# Patient Record
Sex: Female | Born: 1965 | Race: White | Hispanic: No | Marital: Married | Smoking: Never smoker
Health system: Southern US, Community
[De-identification: ages and names within clinical notes are randomized; demographics above are authoritative.]

## PROBLEM LIST (undated history)

## (undated) DIAGNOSIS — D219 Benign neoplasm of connective and other soft tissue, unspecified: Secondary | ICD-10-CM

## (undated) HISTORY — DX: Benign neoplasm of connective and other soft tissue, unspecified: D21.9

## (undated) HISTORY — PX: HEEL SPUR SURGERY: SHX665

## (undated) HISTORY — PX: SHOULDER SURGERY: SHX246

---

## 1999-06-16 ENCOUNTER — Ambulatory Visit: Admission: RE | Admit: 1999-06-16 | Discharge: 1999-06-16 | Payer: Self-pay | Admitting: Orthopedic Surgery

## 2000-11-18 ENCOUNTER — Encounter: Payer: Self-pay | Admitting: Family Medicine

## 2000-11-18 ENCOUNTER — Encounter: Admission: RE | Admit: 2000-11-18 | Discharge: 2000-11-18 | Payer: Self-pay | Admitting: Family Medicine

## 2000-11-26 ENCOUNTER — Encounter: Admission: RE | Admit: 2000-11-26 | Discharge: 2000-11-26 | Payer: Self-pay | Admitting: Family Medicine

## 2000-11-26 ENCOUNTER — Encounter: Payer: Self-pay | Admitting: Family Medicine

## 2001-02-11 ENCOUNTER — Ambulatory Visit (HOSPITAL_COMMUNITY): Admission: RE | Admit: 2001-02-11 | Discharge: 2001-02-11 | Payer: Self-pay | Admitting: *Deleted

## 2001-02-11 ENCOUNTER — Encounter: Payer: Self-pay | Admitting: *Deleted

## 2001-07-29 ENCOUNTER — Ambulatory Visit (HOSPITAL_BASED_OUTPATIENT_CLINIC_OR_DEPARTMENT_OTHER): Admission: RE | Admit: 2001-07-29 | Discharge: 2001-07-29 | Payer: Self-pay | Admitting: Orthopaedic Surgery

## 2002-02-12 ENCOUNTER — Ambulatory Visit (HOSPITAL_COMMUNITY): Admission: RE | Admit: 2002-02-12 | Discharge: 2002-02-12 | Payer: Self-pay | Admitting: Obstetrics and Gynecology

## 2002-02-12 ENCOUNTER — Encounter: Payer: Self-pay | Admitting: Obstetrics and Gynecology

## 2002-03-25 ENCOUNTER — Ambulatory Visit (HOSPITAL_COMMUNITY): Admission: RE | Admit: 2002-03-25 | Discharge: 2002-03-25 | Payer: Self-pay

## 2002-04-29 ENCOUNTER — Ambulatory Visit (HOSPITAL_COMMUNITY): Admission: RE | Admit: 2002-04-29 | Discharge: 2002-04-29 | Payer: Self-pay

## 2002-04-30 ENCOUNTER — Encounter: Admission: RE | Admit: 2002-04-30 | Discharge: 2002-04-30 | Payer: Self-pay

## 2002-09-22 ENCOUNTER — Encounter (HOSPITAL_COMMUNITY): Admission: RE | Admit: 2002-09-22 | Discharge: 2002-09-22 | Payer: Self-pay

## 2002-09-28 ENCOUNTER — Inpatient Hospital Stay (HOSPITAL_COMMUNITY): Admission: AD | Admit: 2002-09-28 | Discharge: 2002-10-02 | Payer: Self-pay

## 2002-09-30 ENCOUNTER — Encounter (INDEPENDENT_AMBULATORY_CARE_PROVIDER_SITE_OTHER): Payer: Self-pay | Admitting: Interventional Cardiology

## 2002-10-29 ENCOUNTER — Encounter: Payer: Self-pay | Admitting: Orthopedic Surgery

## 2002-10-29 ENCOUNTER — Ambulatory Visit (HOSPITAL_COMMUNITY): Admission: RE | Admit: 2002-10-29 | Discharge: 2002-10-29 | Payer: Self-pay | Admitting: Orthopedic Surgery

## 2003-11-05 ENCOUNTER — Encounter: Admission: RE | Admit: 2003-11-05 | Discharge: 2003-11-05 | Payer: Self-pay | Admitting: Family Medicine

## 2003-11-15 ENCOUNTER — Encounter: Admission: RE | Admit: 2003-11-15 | Discharge: 2003-11-15 | Payer: Self-pay | Admitting: Family Medicine

## 2004-05-19 ENCOUNTER — Encounter: Admission: RE | Admit: 2004-05-19 | Discharge: 2004-05-19 | Payer: Self-pay | Admitting: Family Medicine

## 2004-06-15 ENCOUNTER — Other Ambulatory Visit: Admission: RE | Admit: 2004-06-15 | Discharge: 2004-06-15 | Payer: Self-pay | Admitting: Obstetrics and Gynecology

## 2004-11-23 ENCOUNTER — Encounter: Admission: RE | Admit: 2004-11-23 | Discharge: 2004-11-23 | Payer: Self-pay | Admitting: Family Medicine

## 2004-12-25 ENCOUNTER — Encounter: Admission: RE | Admit: 2004-12-25 | Discharge: 2004-12-25 | Payer: Self-pay | Admitting: *Deleted

## 2005-06-19 ENCOUNTER — Other Ambulatory Visit: Admission: RE | Admit: 2005-06-19 | Discharge: 2005-06-19 | Payer: Self-pay | Admitting: Obstetrics and Gynecology

## 2005-12-03 ENCOUNTER — Encounter: Admission: RE | Admit: 2005-12-03 | Discharge: 2005-12-03 | Payer: Self-pay | Admitting: Family Medicine

## 2006-06-24 ENCOUNTER — Other Ambulatory Visit: Admission: RE | Admit: 2006-06-24 | Discharge: 2006-06-24 | Payer: Self-pay | Admitting: Obstetrics and Gynecology

## 2007-09-09 ENCOUNTER — Other Ambulatory Visit: Admission: RE | Admit: 2007-09-09 | Discharge: 2007-09-09 | Payer: Self-pay | Admitting: Obstetrics and Gynecology

## 2008-11-22 ENCOUNTER — Other Ambulatory Visit: Admission: RE | Admit: 2008-11-22 | Discharge: 2008-11-22 | Payer: Self-pay | Admitting: Obstetrics and Gynecology

## 2008-11-30 ENCOUNTER — Encounter: Admission: RE | Admit: 2008-11-30 | Discharge: 2008-11-30 | Payer: Self-pay | Admitting: Obstetrics and Gynecology

## 2008-12-03 ENCOUNTER — Encounter: Admission: RE | Admit: 2008-12-03 | Discharge: 2008-12-03 | Payer: Self-pay | Admitting: Obstetrics and Gynecology

## 2009-06-03 ENCOUNTER — Encounter: Admission: RE | Admit: 2009-06-03 | Discharge: 2009-06-03 | Payer: Self-pay | Admitting: Obstetrics and Gynecology

## 2009-11-23 ENCOUNTER — Other Ambulatory Visit: Admission: RE | Admit: 2009-11-23 | Discharge: 2009-11-23 | Payer: Self-pay | Admitting: Obstetrics and Gynecology

## 2009-11-25 ENCOUNTER — Encounter: Admission: RE | Admit: 2009-11-25 | Discharge: 2009-11-25 | Payer: Self-pay | Admitting: Obstetrics and Gynecology

## 2010-05-26 ENCOUNTER — Encounter: Admission: RE | Admit: 2010-05-26 | Discharge: 2010-05-26 | Payer: Self-pay | Admitting: Obstetrics and Gynecology

## 2010-11-07 LAB — HM COLONOSCOPY

## 2010-12-04 ENCOUNTER — Other Ambulatory Visit
Admission: RE | Admit: 2010-12-04 | Discharge: 2010-12-04 | Payer: Self-pay | Source: Home / Self Care | Admitting: Obstetrics and Gynecology

## 2010-12-04 ENCOUNTER — Other Ambulatory Visit: Payer: Self-pay | Admitting: Obstetrics and Gynecology

## 2010-12-09 ENCOUNTER — Encounter: Payer: Self-pay | Admitting: Family Medicine

## 2010-12-20 ENCOUNTER — Encounter: Payer: Self-pay | Admitting: Obstetrics and Gynecology

## 2011-01-30 ENCOUNTER — Other Ambulatory Visit: Payer: Self-pay | Admitting: Dermatology

## 2011-03-08 ENCOUNTER — Other Ambulatory Visit: Payer: Self-pay | Admitting: Dermatology

## 2011-03-18 ENCOUNTER — Encounter: Payer: Self-pay | Admitting: Family Medicine

## 2011-03-18 ENCOUNTER — Inpatient Hospital Stay (INDEPENDENT_AMBULATORY_CARE_PROVIDER_SITE_OTHER)
Admission: RE | Admit: 2011-03-18 | Discharge: 2011-03-18 | Disposition: A | Discharge status: Home / Self Care | Payer: Self-pay | Source: Ambulatory Visit | Attending: Family Medicine | Admitting: Family Medicine

## 2011-03-18 DIAGNOSIS — H9209 Otalgia, unspecified ear: Secondary | ICD-10-CM

## 2011-03-18 DIAGNOSIS — J309 Allergic rhinitis, unspecified: Secondary | ICD-10-CM | POA: Insufficient documentation

## 2011-03-18 DIAGNOSIS — M542 Cervicalgia: Secondary | ICD-10-CM

## 2011-03-18 LAB — CONVERTED CEMR LAB: Rapid Strep: NEGATIVE

## 2011-03-20 ENCOUNTER — Telehealth (INDEPENDENT_AMBULATORY_CARE_PROVIDER_SITE_OTHER): Payer: Self-pay

## 2011-04-06 NOTE — Op Note (Signed)
[place]  Patient:    Jennifer [name], Jennifer [name] Visit Number: [id number] MRN: [id number]          Service Type: DSU Location: [facility] Attending Physician:  Marcene Corning Dictated by:   Lubertha Basque. Jerl Santos, M.D. Proc. Date: 07/29/01 Admit Date:  07/29/2001                             Operative Report  PREOPERATIVE DIAGNOSIS:  Right shoulder impingement.  POSTOPERATIVE DIAGNOSES: 1. Right shoulder impingement. 2. Right shoulder adhesions.  PROCEDURES: 1. Right shoulder arthroscopic acromioplasty. 2. Right shoulder arthroscopic debridement.  ANESTHESIA:  General.  SURGEON:  Lubertha Basque. Jerl Santos, M.D.  ASSISTANT:  Prince Rome, P.A.  INDICATION FOR PROCEDURE:  The patient is a 45 year old woman with a long history of right shoulder pain.  This has persisted despite oral anti-inflammatories, activity restriction, and a subacromial injection, which did afford her temporary relief.  She is offered an arthroscopy of the procedure.  The procedure was discussed with the patient, and informed operative consent was obtained after discussion of possible complications of, reaction to the anesthesia, and infection.  DESCRIPTION OF PROCEDURE:  The patient was taken to the operating suite, where a general anesthetic was applied without difficulty.  She was positioned in the beach chair position and prepped and draped in the normal sterile fashion. After the administration of preop IV antibiotics, an arthroscopy of the right shoulder was performed through a total of two portals.  The glenohumeral joint showed no degenerative change, and all labral structures were well-attached, including the biceps anchor.  She did have an adhesion from the rotator cuff under the biceps tendon.  This was debrided and released.  The rotator cuff otherwise appeared benign on undersurface inspection all the way to the attachment site on the greater tuberosity.  In the subacromial  space she had some bursitis and no tearing of the rotator cuff, though it did appear to be inflamed.  She had a prominent subacromial morphology, addressed with an acromioplasty back to a flat surface.  This was done with the bur in the lateral position, followed by transfer of the bur to the posterior position. Her AC joint was not addressed, as she had no pain in that location and it did not appear to impinge on the rotator cuff.  The shoulder was thoroughly irrigated at the end of the case, followed by placement of Marcaine with epinephrine and morphine.  Simple sutures of nylon were used to reapproximate the two portals, followed by Adaptic and a dry gauze dressing with tape. Estimated blood loss and intraoperative fluids can be obtained from anesthesia records.  DISPOSITION:  The patient was extubated in the operating room and taken to the recovery room in stable condition.  Plans were for her to go home the same day and to follow up in the office in less than a week.  I will contact her by phone tonight. Dictated by:   Lubertha Basque Jerl Santos, M.D. Attending Physician:  Marcene Corning DD:  07/29/01 TD:  07/29/01 Job: [id number]

## 2011-04-06 NOTE — Consult Note (Signed)
NAME:  Jennifer [name], VANDERHOOF                            ACCOUNT NO.:  [id number]   MEDICAL RECORD NO.:  [id number]                   PATIENT TYPE:  INP   LOCATION:  9101                                 FACILITY:  [facility]   PHYSICIAN:  Lesleigh Noe, M.D.            DATE OF BIRTH:  09-08-1966   DATE OF CONSULTATION:  09/30/2002  DATE OF DISCHARGE:                                   CONSULTATION   REASON FOR CONSULTATION:  Sepsis, dyspnea.   </CONCLUSIONS>  1. Chest discomfort and dyspnea, probably secondary to anemia (pulmonary     embolus and acute myocardial infarction have been ruled out by EKG,     enzymes, echo, and spiral CT).  2. Status post vaginal delivery, day #1.   RECOMMENDATIONS:  1. No specific therapy.  2. Observation.  3. Usual postpartum activity.  4. Iron therapy to help repair anemia.  5. Call if we can help further.   COMMENTS:  The patient is a previously healthy 45 year old gravida 1, para 1  who is status post vaginal delivery on 09/29/02 at 5 a.m.  This morning,  09/30/02, she developed dyspnea and feeling of chest pressure after  ambulating, and also noticed this sensation while she was sitting. This has  never occurred before. She did not have it yesterday.  There is no history  of heart disease.  She did have some chest discomfort approximately 13 years  ago and saw a physician about it, but never had the complete work-up  performed because the discomfort resolved spontaneously. She denies  orthopnea.  There is no lower extremities swelling, no history of  hypertension, heart disease. She has been told in the past that she had a  heart murmur, but this was by 1 physician on only 1 occasion. There have  been on physical activity limitations throughout her life.  There is no  history of syncope. She does not smoke or drink.   ALLERGIES:  Vicodin causes nausea and vomiting.   MEDICATIONS ON ADMISSION:  Iron and Zyrtec.   FAMILY HISTORY:  Negative for heart  disease.   PHYSICAL EXAMINATION:  VITAL SIGNS:  Respiratory rate of 18 per minute,  blood pressure 122/70, heart rate 88.  GENERAL:  Patient is somewhat pale-  appearing in no respiratory distress.  Nail beds are free of any evidence of  cyanosis.  HEENT:  Pupils are equal and reactive. Conjunctivae somewhat  pale.  LUNGS:  Chest clear to auscultation and percussion.  CARDIOVASCULAR:  Normal with the exception of a faint 1/6 systolic murmur  heard at the left upper sternal border, most consistent with pulmonic  outflow murmur.  ABDOMEN:  Soft, no masses are noted. Liver edge is not  palpable.  EXTREMITIES:  No edema. Radial and posterior tibial pulses are bounding.   LABORATORY DATA:  Patient's EKG reveals normal sinus rhythm, normal tracing,  no evidence of acute  injury, pericarditis, conduction abnormalities or  chamber enlargement. She also had an EKG performed while standing that  revealed inferior T wave changes but I think this is simply related to shift  in axis of the heart and possibly hyperventilation.  Hemoglobin this  afternoon is 8.8, hemoglobin on admission 12.5. Potassium 3.8, BUN 11,  creatinine 0.8. A 2-D echocardiogram done this afternoon is basically  normal, LV cavity size is normal, LV function is normal, no valvular  abnormalities are noted; certainly, no evidence of contractility problems  that would suggest postpartum cardiomyopathy. There is no pericardial  effusion.  Spiral CT was performed at [facility] and by report from the  nurse, was said to be normal.  Troponin I 0.02, CK-MB 212/8.6.   DISCUSSION:  I do not get the sense that there is any acute cardiac problem  at this time.  I suspect that the patient's sensations of chest discomfort  and dyspnea are related to relatively acute development of anemia  postpartum.  Hopefully, as she had spontaneous diuresis and there is  hemoconcentration, over the next 12-36 hours, she will feel better. I would  not  recommend any specific further cardiac evaluation or limitations on  physical activity, other than as dictated by the way she feels.   If I may be of further assistance, do not hesitate to call.                                                 Lesleigh Noe, M.D.    HWS/MEDQ  D:  09/30/2002  T:  09/30/2002  Job:  [id number]   cc:   Ronda Fairly. Galen Daft, M.D.   Bryan Lemma. Manus Gunning, M.D.  [address] [place]  [place] [address]  Fax: [phone]

## 2011-04-06 NOTE — Discharge Summary (Signed)
NAME:  Jennifer [name], Jennifer [name]                            ACCOUNT NO.:  [id number]   MEDICAL RECORD NO.:  [id number]                   PATIENT TYPE:  INP   LOCATION:  9101                                 FACILITY:  [facility]   PHYSICIAN:  Ronda Fairly. Galen Daft, M.D.              DATE OF BIRTH:  15-Aug-1966   DATE OF ADMISSION:  09/28/2002  DATE OF DISCHARGE:  10/02/2002                                 DISCHARGE SUMMARY   ADMISSION DIAGNOSIS:  Term pregnancy, labor.   PRINCIPAL DIAGNOSIS:  Term pregnancy, labor.   SECONDARY DIAGNOSES:  1. Postpartum chest pain.  2. Anemia postpartum.  3. Premature ventricular contractions.  4. Term pregnancy, delivered.   PRINCIPAL PROCEDURE:  Spontaneous vaginal delivery.   SECONDARY PROCEDURES:  1. Spiral CT scan.  2. Cardiac echo.  3. EKG.  4. Chest x-ray.   CONDITION ON DISCHARGE:  Stable.   COMPLICATIONS OF HOSPITALIZATION:  None.   CONSULTATIONS:  Cardiology.   FINAL DIAGNOSES:  1. Term pregnancy, delivered.  2. Atypical chest pain not otherwise specified.   HOSPITAL COURSE:  The patient was admitted on September 28, 2002 just shortly  before midnight.  She then delivered on September 29, 2002 a healthy baby  girl, Kara Mead, and this was an uncomplicated spontaneous vaginal delivery.  There was a small laceration which was repaired with suture without  difficulty using local anesthetic.  The patient had no epidural anesthesia  and went naturally. The placenta delivered spontaneous and there was no  significant bleeding in the antepartum or postpartum period.  However, her  hemoglobin was 8.6 g on postoperative day #1.  On review of systems on  postoperative day #1 the patient reported to have chest tightness described  as a pressure, midsternal, especially when there was any exertion.  It was  associated with shortness of breath.  There were no extremity problems, no  edema, no other symptoms.  She was feeling faint, near syncope on postpartum  day #1  when she first got up, but otherwise completely negative as far as  that was concerned.  The symptoms were quite persistent and reproducible  with the same type of activity.  This was concerning for my patient that  this young 45 year old woman could have possible angina.  There was no  significant family history but nevertheless, given the immediate postpartum  status, differential diagnosis included chest pain of cardiac origin from  angina or ischemia versus cardiomyopathy versus possible pulmonary embolism.  Pulmonary embolism was lower on the diagnostic list because of the at rest  asymptomatic nature and there were no findings on examination to suggest  that.  The chest x-ray was performed and that was negative.  Spiral CT scan  was also performed to exclude any embolic-appearing phenomena which was also  negative.  The patient had a cardiology consultation.  Cardiac enzymes  initially showed slight elevation of the MB fraction  at 8.6 with the total  CK being 212, the ratio slightly elevated at 4.1.  The troponin I was normal  at 0.02.  These results were repeated the following day and the CK showed MB  fraction was 4.8 and the total was 137.  Again, slightly elevated but the  troponin again came back negative at 0.02.  Her hemoglobin was 8.8 g at that  stage, slightly elevated white count at 17,000 with normal platelet count.  The patient's symptoms were slightly better but still persistent at that  point.  The patient was prescribed aspirin therapy for discharge.  Her  examination from a cardiac standpoint and a pulmonary standpoint and  physical exam was all negative on the day of discharge and prior.   DISPOSITION:  1. The patient was discharged home on iron tablets twice a day.  2. Avoid any physical exertion but not to remain at complete bedrest either.  3. Follow up in the office in six to seven weeks.  4. Report any increase in symptoms to me and to cardiology.  5. Follow up  with her primary care physician to seen if any further testing     needed to be done for the chest pain, certainly if this persists or even     if this resolves, to see if any further testing or provocative testing     should be done after recovery.   DISCHARGE MEDICATIONS:  1. One aspirin daily.  2. Two iron tablets daily.  3. Prenatal vitamins.  4. Tylenol for pain.   DIET:  Regular.   WOUND CARE:  Limited to routine perineal care.   ACTIVITY:  Limits were to be pretty much taking it easy without any  significant travel or ambulation.  She was to walk about the house, etc.,  but not any long walks or exercise-type related phenomena.    FOLLOW-UP:  The patient was instructed to follow up with her family  physician in approximately one to two weeks.                                               Ronda Fairly. Galen Daft, M.D.    N[place]T/MEDQ  D:  10/02/2002  T:  10/03/2002  Job:  [id number]   cc:   Bryan Lemma. Manus Gunning, M.D.  [address] [place]  [place] [address]  Fax: [phone]   Lyn Records III, M.D.  [address]  [place]  [place] [address]  Fax: [phone]

## 2011-05-17 ENCOUNTER — Other Ambulatory Visit: Payer: Self-pay | Admitting: Obstetrics and Gynecology

## 2011-05-17 DIAGNOSIS — Z1231 Encounter for screening mammogram for malignant neoplasm of breast: Secondary | ICD-10-CM

## 2011-06-08 ENCOUNTER — Ambulatory Visit
Admission: RE | Admit: 2011-06-08 | Discharge: 2011-06-08 | Disposition: A | Discharge status: Home / Self Care | Payer: BC Managed Care – PPO | Source: Ambulatory Visit | Attending: Obstetrics and Gynecology | Admitting: Obstetrics and Gynecology

## 2011-06-08 DIAGNOSIS — Z1231 Encounter for screening mammogram for malignant neoplasm of breast: Secondary | ICD-10-CM

## 2011-10-17 ENCOUNTER — Emergency Department
Admission: EM | Admit: 2011-10-17 | Discharge: 2011-10-17 | Disposition: A | Discharge status: Home / Self Care | Payer: BC Managed Care – PPO | Source: Home / Self Care | Attending: Family Medicine | Admitting: Family Medicine

## 2011-10-17 ENCOUNTER — Encounter: Payer: Self-pay | Admitting: Emergency Medicine

## 2011-10-17 DIAGNOSIS — J069 Acute upper respiratory infection, unspecified: Secondary | ICD-10-CM

## 2011-10-17 LAB — POCT RAPID STREP A (OFFICE): Rapid Strep A Screen: NEGATIVE

## 2011-10-17 LAB — POCT INFLUENZA A/B
Influenza A, POC: NEGATIVE
Influenza B, POC: NEGATIVE

## 2011-10-17 MED ORDER — BENZONATATE 200 MG PO CAPS: 200.0000 mg | ORAL_CAPSULE | Freq: Every day | ORAL | Status: AC

## 2011-10-17 MED ORDER — AZITHROMYCIN 250 MG PO TABS: ORAL_TABLET | ORAL | Status: AC

## 2011-10-17 NOTE — ED Notes (Signed)
Cough, sore throat, chills, body aches since yesterday morning

## 2011-10-17 NOTE — ED Provider Notes (Signed)
History     CSN: [id number] Arrival date & time: 10/17/2011  8:46 AM   First MD Initiated Contact with Patient 10/17/11 0914      Chief Complaint  Patient presents with  . Cough      HPI Comments: Patient complains of one day history of gradually progressive URI symptoms beginning with a mild sore throat (now improved), followed by progressive nasal congestion.  A cough started last night.  Complains of fatigue and initial myalgias.  Cough is generally non-productive during the day.  There has been no pleuritic pain, shortness of breath, or wheezes.  She states that she just recovered from a cold one week ago that lasted about two weeks.  She feels worse this time with more flu-like symptoms.  She has not had a flu shot.  Patient is a 45 y.o. female presenting with cough. The history is provided by the patient.  Cough This is a recurrent problem. The current episode started 12 to 24 hours ago. The problem occurs every few minutes. The problem has been gradually worsening. The cough is non-productive. There has been no fever. Associated symptoms include chills, sore throat and myalgias. Pertinent negatives include no chest pain, no sweats, no ear congestion, no ear pain, no headaches, no rhinorrhea, no shortness of breath, no wheezing and no eye redness. She has tried nothing for the symptoms. She is not a smoker. Her past medical history does not include pneumonia.    No past medical history on file.  No past surgical history on file.  No family history on file.  History  Substance Use Topics  . Smoking status: Not on file  . Smokeless tobacco: Not on file  . Alcohol Use: Not on file    OB History    Grav Para Term Preterm Abortions TAB SAB Ect Mult Living                  Review of Systems  Constitutional: Positive for chills and fatigue. Negative for fever.  HENT: Positive for congestion and sore throat. Negative for ear pain, rhinorrhea and sinus pressure.   Eyes:  Negative for redness.  Respiratory: Positive for cough. Negative for chest tightness, shortness of breath and wheezing.   Cardiovascular: Negative for chest pain.  Gastrointestinal: Negative.   Genitourinary: Negative.   Musculoskeletal: Positive for myalgias.  Skin: Negative.   Neurological: Negative for headaches.    Allergies  Vicodin  Home Medications   Current Outpatient Rx  Name Route Sig Dispense Refill  . CETIRIZINE HCL 10 MG PO TABS Oral Take 10 mg by mouth daily.      . AZITHROMYCIN 250 MG PO TABS  Take 2 tabs today; then begin one tab once daily for 4 more days. (Rx void after 10/24/11)    6 each 0  . BENZONATATE 200 MG PO CAPS Oral Take 1 capsule (200 mg total) by mouth at bedtime. Take as needed for cough 12 capsule 0    BP 114/75  Pulse 65  Temp(Src) 98.5 F (36.9 C) (Oral)  Resp 16  Ht 5\' 7"  (1.702 m)  Wt 161 lb (73.029 kg)  BMI 25.22 kg/m2  SpO2 100%  LMP 10/14/2011  Physical Exam  Nursing note and vitals reviewed. Constitutional: She is oriented to person, place, and time. She appears well-developed and well-nourished. No distress.  HENT:  Head: Normocephalic.  Right Ear: External ear normal.  Left Ear: External ear normal.  Nose: Mucosal edema and rhinorrhea present. No sinus  tenderness.  Mouth/Throat: Oropharynx is clear and moist. No oropharyngeal exudate.  Eyes: Conjunctivae and EOM are normal. Pupils are equal, round, and reactive to light. Right eye exhibits no discharge. Left eye exhibits no discharge.  Neck: Normal range of motion. Neck supple.  Cardiovascular: Normal rate, regular rhythm and normal heart sounds.   Pulmonary/Chest: Effort normal and breath sounds normal. She has no wheezes. She has no rales. She exhibits no tenderness.  Abdominal: Soft. Bowel sounds are normal. There is no tenderness.  Musculoskeletal: She exhibits no edema.  Lymphadenopathy:    She has cervical adenopathy.       Right cervical: Posterior cervical adenopathy  present.       Left cervical: Posterior cervical adenopathy present.  Neurological: She is alert and oriented to person, place, and time.  Skin: Skin is warm and dry. She is not diaphoretic.     ED Course  Procedures none   Labs Reviewed  POCT RAPID STREP A (OFFICE) negatuve  POCT INFLUENZA A/B negative      1. Acute upper respiratory infections of unspecified site       MDM  There is no evidence of bacterial infection today.  Suspect recurrent viral URI Treat symptomatically for now:  Increase fluid intake, begin expectorant, topical decongestant, saline nasal spray/saline irrigation, cough suppressant at bedtime. If fever/chills persist, or if not improving 5 days begin Z-pack (given Rx to hold).  Followup with PCP if not improving 7 to 10 days. Recommend flu shot when well       Donna Christen, MD 10/19/11 2233

## 2011-10-22 NOTE — Progress Notes (Signed)
Summary: ear pain/TM (rm 3)   Vital Signs:  Patient Profile:   45 Years Old Female CC:      right ear pain x last night Height:     66.5 inches Weight:      159 pounds O2 Sat:      100 % O2 treatment:    Room Air Temp:     98.7 degrees F oral Pulse rate:   67 / minute Resp:     14 per minute BP sitting:   116 / 77  (left arm) Cuff size:   regular  Vitals Entered By: Lajean Saver RN (March 18, 2011 12:44 PM)                  Updated Prior Medication List: FLONASE 50 MCG/ACT SUSP (FLUTICASONE PROPIONATE)  ZYRTEC HIVES RELIEF 10 MG TABS (CETIRIZINE HCL)   Current Allergies: ! VICODINHistory of Present Illness Chief Complaint: right ear pain x last night History of Present Illness:  Subjective: Patient complains of onset of a right earache last night, swelling in her right neck.  Her right ear feels clogged.  ? mild sore throat.  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 No cough No pleuritic pain No wheezing No nasal congestion No post-nasal drainage No sinus pain/pressure No itchy/red eyes No hemoptysis No SOB No fever/chills No nausea No vomiting No abdominal pain No diarrhea No skin rashes No fatigue No myalgias No headache    REVIEW OF SYSTEMS Constitutional Symptoms      Denies fever, chills, night sweats, weight loss, weight gain, and fatigue.  Eyes       Denies change in vision, eye pain, eye discharge, glasses, contact lenses, and eye surgery. Ear/Nose/Throat/Mouth       Complains of ear pain.      Denies hearing loss/aids, change in hearing, ear discharge, dizziness, frequent runny nose, frequent nose bleeds, sinus problems, sore throat, hoarseness, and tooth pain or bleeding.  Respiratory       Denies dry cough, productive cough, wheezing, shortness of breath, asthma, bronchitis, and emphysema/COPD.  Cardiovascular       Denies murmurs, chest pain, and tires easily with  exhertion.    Gastrointestinal       Denies stomach pain, nausea/vomiting, diarrhea, constipation, blood in bowel movements, and indigestion. Genitourniary       Denies painful urination, kidney stones, and loss of urinary control. Neurological       Denies paralysis, seizures, and fainting/blackouts. Musculoskeletal       Denies muscle pain, joint pain, joint stiffness, decreased range of motion, redness, swelling, muscle weakness, and gout.  Skin       Denies bruising, unusual mles/lumps or sores, and hair/skin or nail changes.  Psych       Denies mood changes, temper/anger issues, anxiety/stress, speech problems, depression, and sleep problems. Other Comments: Patient c/o right ear pain x last night. It started when she was chewing her food. The past three times she has eaten her right ear begins to hurt and swelling around the ear develops   Past History:  Past Medical History: Allergic rhinitis  Past Surgical History: right shoulder right foot  Social History: Never Smoked Alcohol use-no Drug use-no Smoking Status:  never Drug Use:  no   Objective:  Appearance:  Patient appears healthy, stated age, and in no acute distress  Eyes:  Pupils are equal, round, and reactive to light  and accomdation.  Extraocular movement is intact.  Conjunctivae are not inflamed.  Ears:  Canals normal.  Tympanic membranes normal.   Nose:  Mildly congested turbinates.  No sinus tenderness  Pharynx:  Normal  Neck:  Supple.  No adenopathy is present.   Lungs:  Clear to auscultation.  Breath sounds are equal.  Heart:  Regular rate and rhythm without murmurs, rubs, or gallops.  Abdomen:  Nontender without masses or hepatosplenomegaly.  Bowel sounds are present.  No CVA or flank tenderness.  Skin:  No rash Rapid strep test negative  Tympanometry:  Normal left ear; High peak height right ear Assessment New Problems: EAR PAIN, RIGHT (ICD-388.70) NECK PAIN, RIGHT (ICD-723.1) ALLERGIC RHINITIS  (ICD-477.9)  NO EVIDENCE BACTERIAL INFECTION TODAY.  ? EARLY VIRAL URI  Plan New Orders: Rapid Strep [16109] T-Culture, Throat [[id number]] Tympanometry [91478] Services provided After hours-Weekends-Holidays [99051] New Patient Level III [29562] Planning Comments:   Throat culture pending Treat symptomatically for now  Follow-up with PCP for persistent symptoms   The patient and/or caregiver has been counseled thoroughly with regard to medications prescribed including dosage, schedule, interactions, rationale for use, and possible side effects and they verbalize understanding.  Diagnoses and expected course of recovery discussed and will return if not improved as expected or if the condition worsens. Patient and/or caregiver verbalized understanding.   Orders Added: 1)  Rapid Strep [87880] 2)  T-Culture, Throat [[id number]] 3)  Tympanometry [96295] 4)  Services provided After hours-Weekends-Holidays [99051] 5)  New Patient Level III [99203]    Laboratory Results  Date/Time Received: March 18, 2011 1:27 PM  Date/Time Reported: March 18, 2011 1:27 PM   Other Tests  Rapid Strep: negative  Kit Test Internal QC: Negative   (Normal Range: Negative)

## 2011-10-22 NOTE — Telephone Encounter (Signed)
  Phone Note Outgoing Call   Call placed by: Linton Flemings RN,  Mar 20, 2011 6:08 PM Call placed to: Patient Summary of Call: message left on VM, call facility with questions/concerns

## 2011-12-05 ENCOUNTER — Other Ambulatory Visit: Payer: Self-pay | Admitting: Obstetrics and Gynecology

## 2011-12-05 ENCOUNTER — Other Ambulatory Visit (HOSPITAL_COMMUNITY)
Admission: RE | Admit: 2011-12-05 | Discharge: 2011-12-05 | Disposition: A | Discharge status: Home / Self Care | Payer: BC Managed Care – PPO | Source: Ambulatory Visit | Attending: Obstetrics and Gynecology | Admitting: Obstetrics and Gynecology

## 2011-12-05 DIAGNOSIS — Z01419 Encounter for gynecological examination (general) (routine) without abnormal findings: Secondary | ICD-10-CM | POA: Insufficient documentation

## 2012-04-30 ENCOUNTER — Other Ambulatory Visit: Payer: Self-pay | Admitting: Obstetrics and Gynecology

## 2012-11-05 ENCOUNTER — Ambulatory Visit: Payer: BC Managed Care – PPO | Admitting: Sports Medicine

## 2012-12-16 ENCOUNTER — Other Ambulatory Visit (HOSPITAL_COMMUNITY)
Admission: RE | Admit: 2012-12-16 | Discharge: 2012-12-16 | Disposition: A | Discharge status: Home / Self Care | Payer: BC Managed Care – PPO | Source: Ambulatory Visit | Attending: Obstetrics and Gynecology | Admitting: Obstetrics and Gynecology

## 2012-12-16 ENCOUNTER — Other Ambulatory Visit: Payer: Self-pay | Admitting: Obstetrics and Gynecology

## 2012-12-16 DIAGNOSIS — Z1151 Encounter for screening for human papillomavirus (HPV): Secondary | ICD-10-CM | POA: Insufficient documentation

## 2012-12-16 DIAGNOSIS — Z01419 Encounter for gynecological examination (general) (routine) without abnormal findings: Secondary | ICD-10-CM | POA: Insufficient documentation

## 2013-12-16 ENCOUNTER — Other Ambulatory Visit: Payer: Self-pay | Admitting: Obstetrics and Gynecology

## 2013-12-16 ENCOUNTER — Other Ambulatory Visit (HOSPITAL_COMMUNITY)
Admission: RE | Admit: 2013-12-16 | Discharge: 2013-12-16 | Disposition: A | Discharge status: Home / Self Care | Payer: BC Managed Care – PPO | Source: Ambulatory Visit | Attending: Obstetrics and Gynecology | Admitting: Obstetrics and Gynecology

## 2013-12-16 DIAGNOSIS — Z01419 Encounter for gynecological examination (general) (routine) without abnormal findings: Secondary | ICD-10-CM | POA: Insufficient documentation

## 2014-01-19 ENCOUNTER — Other Ambulatory Visit: Payer: Self-pay | Admitting: Obstetrics and Gynecology

## 2014-03-17 ENCOUNTER — Other Ambulatory Visit (HOSPITAL_COMMUNITY): Payer: Self-pay | Admitting: Family Medicine

## 2014-03-17 ENCOUNTER — Other Ambulatory Visit: Payer: Self-pay | Admitting: Internal Medicine

## 2014-03-17 DIAGNOSIS — M898X6 Other specified disorders of bone, lower leg: Secondary | ICD-10-CM

## 2014-03-17 DIAGNOSIS — M84369A Stress fracture, unspecified tibia and fibula, initial encounter for fracture: Secondary | ICD-10-CM

## 2014-03-19 ENCOUNTER — Ambulatory Visit (HOSPITAL_COMMUNITY): Admission: RE | Admit: 2014-03-19 | Payer: BC Managed Care – PPO | Source: Ambulatory Visit

## 2014-03-24 ENCOUNTER — Ambulatory Visit (HOSPITAL_COMMUNITY)
Admission: RE | Admit: 2014-03-24 | Discharge: 2014-03-24 | Disposition: A | Discharge status: Home / Self Care | Payer: BC Managed Care – PPO | Source: Ambulatory Visit | Attending: Family Medicine | Admitting: Family Medicine

## 2014-03-24 ENCOUNTER — Encounter (HOSPITAL_COMMUNITY)
Admission: RE | Admit: 2014-03-24 | Discharge: 2014-03-24 | Disposition: A | Discharge status: Home / Self Care | Payer: BC Managed Care – PPO | Source: Ambulatory Visit | Attending: Family Medicine | Admitting: Family Medicine

## 2014-03-24 DIAGNOSIS — M79609 Pain in unspecified limb: Secondary | ICD-10-CM | POA: Insufficient documentation

## 2014-03-24 DIAGNOSIS — G8929 Other chronic pain: Secondary | ICD-10-CM | POA: Insufficient documentation

## 2014-03-24 DIAGNOSIS — M898X6 Other specified disorders of bone, lower leg: Secondary | ICD-10-CM

## 2014-03-24 MED ORDER — TECHNETIUM TC 99M MEDRONATE IV KIT
26.5000 | PACK | Freq: Once | INTRAVENOUS | Status: AC | PRN
  Administered 2014-03-24: 26.5 via INTRAVENOUS

## 2014-08-09 ENCOUNTER — Encounter: Payer: Self-pay | Admitting: Emergency Medicine

## 2014-08-09 ENCOUNTER — Emergency Department
Admission: EM | Admit: 2014-08-09 | Discharge: 2014-08-09 | Disposition: A | Discharge status: Home / Self Care | Payer: BC Managed Care – PPO | Source: Home / Self Care | Attending: Emergency Medicine | Admitting: Emergency Medicine

## 2014-08-09 DIAGNOSIS — H8309 Labyrinthitis, unspecified ear: Secondary | ICD-10-CM

## 2014-08-09 MED ORDER — MECLIZINE HCL 12.5 MG PO TABS: 12.5000 mg | ORAL_TABLET | Freq: Three times a day (TID) | ORAL | Status: DC | PRN

## 2014-08-09 MED ORDER — ONDANSETRON HCL 4 MG PO TABS: 4.0000 mg | ORAL_TABLET | Freq: Four times a day (QID) | ORAL | Status: DC

## 2014-08-09 NOTE — ED Notes (Signed)
Pt c/o dizziness with nausea and HA x 0300 today. No OTC meds.

## 2014-08-09 NOTE — ED Provider Notes (Signed)
CSN: [id number]     Arrival date & time 08/09/14  1559 History   First MD Initiated Contact with Patient 08/09/14 1629     Chief Complaint  Patient presents with  . Dizziness   (Consider location/radiation/quality/duration/timing/severity/associated sxs/prior Treatment) HPI Onset 3 AM today of positional vertigo, nausea, symptoms mild to moderate but mildly improved the past few hours. Mild diffuse nonspecific headache 3/10 intensity without any other focal neurologic symptoms. No abdominal pain or vomiting or change of bowel habits. No visual changes. Recalls no injury. Has not tried any specific medication for this. The vertigo slightly worsens with change of position  She recalls having a mild viral URI 2 weeks ago which resolved without sequelae. She's been using Flonase the past several weeks for seasonal sinus allergies and that helps sinus allergy symptoms . Currently denies any fever or discolored rhinorrhea or sore throat or neck pain. History reviewed. No pertinent past medical history. Past Surgical History  Procedure Laterality Date  . Heel spur surgery Right   . Shoulder surgery Right    History reviewed. No pertinent family history. History  Substance Use Topics  . Smoking status: Never Smoker   . Smokeless tobacco: Not on file  . Alcohol Use: No   OB History   Grav Para Term Preterm Abortions TAB SAB Ect Mult Living                 Review of Systems  All other systems reviewed and are negative.   Allergies  Hydrocodone-acetaminophen and Influenza vaccines  Home Medications   Prior to Admission medications   Medication Sig Start Date End Date Taking? Authorizing Provider  fluticasone (FLONASE) 50 MCG/ACT nasal spray Place into both nostrils daily.   Yes Historical Provider, MD  meclizine (ANTIVERT) 12.5 MG tablet Take 1 tablet (12.5 mg total) by mouth 3 (three) times daily as needed for dizziness. Take 1 or 2 every 8 hours as needed 08/09/14   Jacqulyn Cane,  MD  ondansetron (ZOFRAN) 4 MG tablet Take 1 tablet (4 mg total) by mouth every 6 (six) hours. As needed for nausea or vomiting 08/09/14   Jacqulyn Cane, MD   BP 111/73  Pulse 57  Temp(Src) 98 F (36.7 C) (Oral)  Resp 16  Ht 5' 6.5" (1.689 m)  Wt 168 lb (76.204 kg)  BMI 26.71 kg/m2  SpO2 100%  LMP 08/02/2014 Physical Exam  Nursing note and vitals reviewed. Constitutional: She is oriented to person, place, and time. She appears well-developed and well-nourished. No distress.  HENT:  Head: Normocephalic and atraumatic.  Right Ear: External ear normal.  Left Ear: External ear normal.  Nose: Nose normal.  Mouth/Throat: Oropharynx is clear and moist. No oropharyngeal exudate.  Eyes: Conjunctivae and EOM are normal. Pupils are equal, round, and reactive to light. Right eye exhibits no discharge. Left eye exhibits no discharge. No scleral icterus.  No nystagmus  Neck: Normal range of motion. Neck supple. No JVD present.  Cardiovascular: Normal rate, regular rhythm and normal heart sounds.  Exam reveals no gallop and no friction rub.   No murmur heard. Pulmonary/Chest: Effort normal and breath sounds normal.  Abdominal: Soft. She exhibits no distension and no mass. There is no tenderness. There is no rebound and no guarding.  Musculoskeletal: Normal range of motion. She exhibits no edema and no tenderness.  Lymphadenopathy:    She has no cervical adenopathy.  Neurological: She is alert and oriented to person, place, and time. She displays normal reflexes. No  cranial nerve deficit. She exhibits normal muscle tone.  Gait within normal limits. Romberg equivocal positive  Skin: Skin is warm. No rash noted.  Psychiatric: She has a normal mood and affect.    ED Course  Procedures (including critical care time) Labs Review Labs Reviewed - No data to display  Imaging Review No results found.   MDM   1. Viral labyrinthitis syndrome, unspecified laterality    no evidence of any acute  neurologic event. Romberg mildly positive. Neurologic exam otherwise intact   Treatment options discussed, as well as risks, benefits, alternatives. Patient voiced understanding and agreement with the following plans: Rest, push fluids, avoid excessive motions, and other advice given . Zofran when necessary nausea Meclizine for vertigo Tylenol or ibuprofen when necessary headache  Follow-up with your primary care doctor in 5-7 days if not improving, or sooner if symptoms become worse. Precautions discussed. Red flags discussed.--Emergency room if any red flag Questions invited and answered. Patient voiced understanding and agreement.    Jacqulyn Cane, MD 08/09/14 2036

## 2015-03-07 ENCOUNTER — Ambulatory Visit (INDEPENDENT_AMBULATORY_CARE_PROVIDER_SITE_OTHER): Payer: BLUE CROSS/BLUE SHIELD | Admitting: Obstetrics & Gynecology

## 2015-03-07 ENCOUNTER — Encounter: Payer: Self-pay | Admitting: Obstetrics & Gynecology

## 2015-03-07 VITALS — BP 117/69 | HR 64 | Resp 16 | Ht 66.0 in | Wt 172.0 lb

## 2015-03-07 DIAGNOSIS — Z1151 Encounter for screening for human papillomavirus (HPV): Secondary | ICD-10-CM

## 2015-03-07 DIAGNOSIS — L290 Pruritus ani: Secondary | ICD-10-CM | POA: Diagnosis not present

## 2015-03-07 DIAGNOSIS — N912 Amenorrhea, unspecified: Secondary | ICD-10-CM | POA: Diagnosis not present

## 2015-03-07 DIAGNOSIS — Z124 Encounter for screening for malignant neoplasm of cervix: Secondary | ICD-10-CM | POA: Diagnosis not present

## 2015-03-07 DIAGNOSIS — Z01419 Encounter for gynecological examination (general) (routine) without abnormal findings: Secondary | ICD-10-CM | POA: Diagnosis not present

## 2015-03-07 DIAGNOSIS — L292 Pruritus vulvae: Secondary | ICD-10-CM | POA: Diagnosis not present

## 2015-03-08 ENCOUNTER — Telehealth: Payer: Self-pay | Admitting: *Deleted

## 2015-03-08 DIAGNOSIS — L299 Pruritus, unspecified: Secondary | ICD-10-CM

## 2015-03-08 LAB — FOLLICLE STIMULATING HORMONE: FSH: 72.7 m[IU]/mL

## 2015-03-08 LAB — TSH: TSH: 3.356 u[IU]/mL (ref 0.350–4.500)

## 2015-03-08 LAB — WET PREP BY MOLECULAR PROBE
CANDIDA SPECIES: NEGATIVE
Gardnerella vaginalis: NEGATIVE
Trichomonas vaginosis: NEGATIVE

## 2015-03-08 LAB — PROLACTIN: Prolactin: 7.2 ng/mL

## 2015-03-08 MED ORDER — CLOBETASOL PROPIONATE 0.05 % EX CREA: TOPICAL_CREAM | CUTANEOUS | Status: DC

## 2015-03-08 NOTE — Telephone Encounter (Signed)
Rx for rectal itching of Clobetasol cream sent to [facility] per TO Dr Gala Romney.

## 2015-03-08 NOTE — Progress Notes (Signed)
  Subjective:     Jennifer [name] is a 49 y.o. female here for a routine exam.  Current complaints: vaginal itching for 6+ weeks.  OTC monistat not helping.  No odor.  No odor with sexual intercourse..  Personal health questionnaire reviewed: yes. Patient has had oligomenorrhea with LMP 7 months ago.  Pt has some mild hot flashes.     Gynecologic History Patient's last menstrual period was 08/02/2014. Contraception: post menopausal status Last Pap: 2015. Results were: normal per patient.  Unsure if cotesting was done.  Not on the records that were provided. Last mammogram: 2014. Results were: normal per patient  Obstetric History OB History  Gravida Para Term Preterm AB SAB TAB Ectopic Multiple Living  1 1 1       1     # Outcome Date GA Lbr Len/2nd Weight Sex Delivery Anes PTL Lv  1 Term             Obstetric Comments  Has an adopted daughter     The following portions of the patient's history were reviewed and updated as appropriate: allergies, current medications, past family history, past medical history, past social history, past surgical history and problem list.  Review of Systems Pertinent items are noted in HPI.    Objective:      Filed Vitals:   03/07/15 1425  BP: 117/69  Pulse: 64  Resp: 16  Height: 5\' 6"  (1.676 m)  Weight: 172 lb (78.019 kg)   Vitals:  WNL General appearance: alert, cooperative and no distress Head: Normocephalic, without obvious abnormality, atraumatic Eyes: negative Throat: lips, mucosa, and tongue normal; teeth and gums normal Lungs: clear to auscultation bilaterally Breasts: normal appearance, no masses or tenderness, No nipple retraction or dimpling, No nipple discharge or bleeding Heart: regular rate and rhythm Abdomen: soft, non-tender; bowel sounds normal; no masses,  no organomegaly  Pelvic:  External Genitalia:  Tanner V, 4-5 cm area of thickened, red skin from vaginal opening extending through perineu and around rectum.  Small  fissures is skin (not assocaited with the anus; approx 1 cm from the anus) Urethra:  No prolapse Vagina:  Pale pink, normal rugae, no blood or discharge Cervix:  No CMT, no lesion Uterus:  Normal size and contour, non tender Adnexa:  Normal, no masses, non tender  Extremities: no edema, redness or tenderness in the calves or thighs Skin: no lesions or rash Lymph nodes: Axillary adenopathy: none        Assessment:    Healthy female exam.   Amenorrhea Vaginal / anal itching   Plan:    Education reviewed: self breast exams and skin cancer screening. Contraception: none. Follow up in: 2 weeks. mammogram recomendations reviewed.  Patient would like to follow USPFTF with next mammogram at 51  Pap smear with cotesting today.  Next pap due in 3 years. Temovate bid for one week and daily for one week.  RTC for eval FSH, TSH, prolactin for amenorrhea.

## 2015-03-21 ENCOUNTER — Encounter: Payer: Self-pay | Admitting: Obstetrics & Gynecology

## 2015-03-21 ENCOUNTER — Ambulatory Visit (INDEPENDENT_AMBULATORY_CARE_PROVIDER_SITE_OTHER): Payer: BLUE CROSS/BLUE SHIELD | Admitting: Obstetrics & Gynecology

## 2015-03-21 VITALS — BP 109/71 | HR 60 | Resp 16 | Ht 66.5 in | Wt 170.0 lb

## 2015-03-21 DIAGNOSIS — N9089 Other specified noninflammatory disorders of vulva and perineum: Secondary | ICD-10-CM

## 2015-03-21 DIAGNOSIS — R87610 Atypical squamous cells of undetermined significance on cytologic smear of cervix (ASC-US): Secondary | ICD-10-CM | POA: Diagnosis not present

## 2015-03-21 DIAGNOSIS — R350 Frequency of micturition: Secondary | ICD-10-CM

## 2015-03-21 LAB — CYTOLOGY - PAP

## 2015-03-21 LAB — POCT URINALYSIS DIPSTICK
Bilirubin, UA: NEGATIVE
Blood, UA: NEGATIVE
GLUCOSE UA: NEGATIVE
Ketones, UA: NEGATIVE
Leukocytes, UA: NEGATIVE
Nitrite, UA: NEGATIVE
PH UA: 6.5
Spec Grav, UA: <=1.005
Urobilinogen, UA: NEGATIVE

## 2015-03-21 NOTE — Progress Notes (Signed)
   Subjective:    Patient ID: Jennifer [name], female    DOB: 02/21/1966, 49 y.o.   MRN: [id number]  HPI Pt having some relief from itching.  The periclitoral itching is still a problem.   Pt informed of ASCUS pap and neg HPV Pt has elevated FSH and is most likely just entering menopause.   Pt has new complaint of frequent urination both dya nad night.  Pt denies burning.  Pt states it has been present for many years but recently worse.  Pt denies odor.    Review of Systems  Constitutional: Negative.   Cardiovascular: Negative.   Gastrointestinal: Negative.   Genitourinary: Positive for frequency.       +periclitoral itching.  Psychiatric/Behavioral: Negative.    Past Medical History  Diagnosis Date  . Fibroids        Objective:   Physical Exam  Constitutional: She appears well-developed and well-nourished. No distress.  HENT:  Head: Normocephalic and atraumatic.  Pulmonary/Chest: Effort normal.  Abdominal: Soft.  Genitourinary: Vagina normal.    No vaginal discharge found.  Vitals reviewed.         Assessment & Plan:  49 yo female with 5 issues  1-ASCUS pap and HPV neg--cotesting in 1 year 2-Early menopause--reveiwed PMB definition; pt has mild hot flashes at this time and no intervention eeded 3-Perianal itching--much improved, continue daily temovate 4-Peri-clitoral itching--reviewed hygiene and to use cetaphil.  Apply temovate to that area as well. 5-Frequent urination--check for infection.  Will also do a voiding dairy.  Will review and send to urology as necessary.  ?IC?

## 2015-03-23 ENCOUNTER — Other Ambulatory Visit (HOSPITAL_COMMUNITY): Payer: Self-pay | Admitting: Dermatology

## 2015-04-12 ENCOUNTER — Ambulatory Visit: Payer: BLUE CROSS/BLUE SHIELD | Admitting: Obstetrics & Gynecology

## 2015-04-13 LAB — LIPID PANEL
Cholesterol: 210 mg/dL — AB (ref 0–200)
HDL: 58 mg/dL (ref 35–70)

## 2015-04-20 ENCOUNTER — Ambulatory Visit (INDEPENDENT_AMBULATORY_CARE_PROVIDER_SITE_OTHER): Payer: BLUE CROSS/BLUE SHIELD | Admitting: Obstetrics & Gynecology

## 2015-04-20 ENCOUNTER — Encounter: Payer: Self-pay | Admitting: Obstetrics & Gynecology

## 2015-04-20 VITALS — BP 106/72 | HR 78 | Ht 66.5 in | Wt 170.0 lb

## 2015-04-20 DIAGNOSIS — R350 Frequency of micturition: Secondary | ICD-10-CM | POA: Diagnosis not present

## 2015-04-20 DIAGNOSIS — N951 Menopausal and female climacteric states: Secondary | ICD-10-CM | POA: Diagnosis not present

## 2015-04-20 DIAGNOSIS — N9089 Other specified noninflammatory disorders of vulva and perineum: Secondary | ICD-10-CM

## 2015-04-20 DIAGNOSIS — L309 Dermatitis, unspecified: Secondary | ICD-10-CM

## 2015-04-20 MED ORDER — POLYETHYLENE GLYCOL 3350 17 GM/SCOOP PO POWD: 17.0000 g | Freq: Every day | ORAL | Status: DC

## 2015-04-20 NOTE — Progress Notes (Signed)
   Subjective:    Patient ID: Jennifer [name], female    DOB: Nov 13, 1966, 49 y.o.   MRN: [id number]  HPI  Patient presents for 3 complaints 1-  Hot flashes continue to worsen at night.  Reviewed treatment options.  Patient would like to try herbal route first.  She will let us know if she would like to try medications. 2-Continued frequent small voids.  Has not had time to do the voiding dairy for 24 hours.  Patient will call us with 2 voiding results.  Nomal urge to void and # of ccs.  Extreme urge to void and # of ccs.  Will refer to urology based onresults.  Denies odor or burning. 3-perianal itching--improved on temovate but reccured once stopping medication.  Patient also having constipation with occasional sense of tearing.  Patinet had colonoscopy several years ago for diarhea and states was nml.   Past Medical History  Diagnosis Date  . Fibroids     Review of Systems  Constitutional: Negative.   Gastrointestinal: Positive for constipation and rectal pain.  Genitourinary: Positive for frequency. Negative for vaginal bleeding, vaginal discharge and vaginal pain.  Skin: Negative.   Psychiatric/Behavioral: Negative.        Objective:   Physical Exam  Constitutional: She is oriented to person, place, and time. She appears well-developed and well-nourished. No distress.  HENT:  Head: Normocephalic and atraumatic.  Eyes: Conjunctivae are normal.  Pulmonary/Chest: Effort normal.  Abdominal: Soft. Bowel sounds are normal. She exhibits no distension and no mass. There is no tenderness. There is no rebound and no guarding.  Genitourinary: Vagina normal.    No vaginal discharge found.  Musculoskeletal: She exhibits no edema.  Neurological: She is alert and oriented to person, place, and time.  Skin: Skin is warm and dry.  Psychiatric: She has a normal mood and affect.  Vitals reviewed.         Assessment & Plan:  49 yo female with 3 problems  1-Worsening hot flashes at  night--herbal remedies for now.  Will return to office if desires medications.  Sleep hygiene discussed 2-Urinary frequency and urgency--voiding dairy as above.  Possible referral to urology for IC. 3-Perianal pain--refer to GI.  Continue temovate.  Miralax for constipation.

## 2015-05-19 ENCOUNTER — Telehealth: Payer: Self-pay

## 2015-05-19 NOTE — Telephone Encounter (Signed)
Patient called clinic and states that she continues to irritation. She has an appointment on 05-31-15 with Dr. Georgina Snell.   Attempted to call patient back and left message for her to return call to office. Kathrene Alu RN BSN

## 2015-05-19 NOTE — Telephone Encounter (Signed)
Dr. Georgina Snell- Primary care doctor. Patient states that Dr. Gala Romney reccommended that she go to a GI doctor. Patient states that he stated that she need dermatology. Then she went to dermatology but the dermatology didn't see any rash. Patient states that she feels that dermatology blew her off.  Patient states she is still have discomfort. Patient states that she doesn't want to use the steroid cream because then her rash goes away before she can get in for appointment. Suggested the patient take a picture of the rash area and then have it to show the doctor at her appointment. Also reccommended that she could call dermatology on the day she has a rash and try to be worked in. Patient agrees with plan. Kathrene Alu RN BSN

## 2015-05-31 ENCOUNTER — Ambulatory Visit: Payer: BLUE CROSS/BLUE SHIELD | Admitting: Family Medicine

## 2015-06-07 ENCOUNTER — Encounter: Payer: Self-pay | Admitting: Family Medicine

## 2015-06-07 ENCOUNTER — Ambulatory Visit (INDEPENDENT_AMBULATORY_CARE_PROVIDER_SITE_OTHER): Payer: BLUE CROSS/BLUE SHIELD | Admitting: Family Medicine

## 2015-06-07 VITALS — BP 116/71 | HR 65 | Ht 66.5 in | Wt 169.0 lb

## 2015-06-07 DIAGNOSIS — L290 Pruritus ani: Secondary | ICD-10-CM | POA: Diagnosis not present

## 2015-06-07 DIAGNOSIS — L299 Pruritus, unspecified: Secondary | ICD-10-CM | POA: Diagnosis not present

## 2015-06-07 DIAGNOSIS — R010 Benign and innocent cardiac murmurs: Secondary | ICD-10-CM

## 2015-06-07 DIAGNOSIS — E663 Overweight: Secondary | ICD-10-CM

## 2015-06-07 DIAGNOSIS — I071 Rheumatic tricuspid insufficiency: Secondary | ICD-10-CM | POA: Insufficient documentation

## 2015-06-07 DIAGNOSIS — R011 Cardiac murmur, unspecified: Secondary | ICD-10-CM

## 2015-06-07 MED ORDER — CLOBETASOL PROPIONATE 0.05 % EX CREA: TOPICAL_CREAM | CUTANEOUS | Status: DC

## 2015-06-07 NOTE — Patient Instructions (Signed)
Thank you for coming in today. 1) get your ultrasound. Call me if this has not been scheduled. Use the cream. Return in 6 months. Return for labs.   Call or go to the emergency room if you get worse, have trouble breathing, have chest pains, or palpitations.   Heart Murmur A heart murmur is an extra sound heard by your health care provider when listening to your heart with a device called a stethoscope. The sound comes from turbulence when blood flows through the heart and may be a "hum" or "whoosh" sound heard when the heart beats. There are two types of heart murmurs:  Innocent murmurs. Most people with this type of heart murmur do not have a heart problem. Many children have innocent heart murmurs. Your health care provider may suggest some basic testing to know whether your murmur is an innocent murmur. If an innocent heart murmur is found, there is no need for further tests or treatment and no need to restrict activities or stop playing sports.  Abnormal murmurs. These types of murmurs can occur in children and adults. In children, abnormal heart murmurs are typically caused from heart defects that are present at birth (congenital). In adults, abnormal murmurs are usually from heart valve problems caused by disease, infection, or aging. CAUSES  All heart murmurs are a result of an issue with your heart valves. Normally, these valves open to let blood flow through or out of your heart and then shut to keep it from flowing backward. If they do not work properly, you could have:  Regurgitation--When blood leaks back through the valve in the wrong direction.  Mitral valve prolapse--When the mitral valve of the heart has a loose flap and does not close tightly.  Stenosis--When the valve does not open enough and blocks blood flow. SIGNS AND SYMPTOMS  Innocent murmurs do not cause symptoms, and many people with abnormal murmurs may or may not have symptoms. If symptoms do develop, they may  include:  Shortness of breath.  Blue coloring of the skin, especially on the fingertips.  Chest pain.  Palpitations, or feeling a fluttering or skipped heartbeat.  Fainting.  Persistent cough.  Getting tired much faster than expected. DIAGNOSIS  A heart murmur might be heard during a sports physical or during any type of examination. When a murmur is heard, it may suggest a possible problem. When this happens, your health care provider may ask you to see a heart specialist (cardiologist). You may also be asked to have one or more heart tests. In these cases, testing may vary depending on what your health care provider heard. Tests for a heart murmur may include:  Electrocardiogram.  Echocardiogram.  MRI. For children and adults who have an abnormal heart murmur and want to play sports, it is important to complete testing, review test results, and receive recommendations from your health care provider. If heart disease is present, it may not be safe to play. TREATMENT  Innocent murmurs require no treatment or activity restriction. If an abnormal murmur represents a problem with the heart, treatment will depend on the exact nature of the problem. In these cases, medicine or surgery may be needed to treat the problem. HOME CARE INSTRUCTIONS If you want to participate in sports or other types of strenuous physical activity, it is important to discuss this first with your health care provider. If the murmur represents a problem with the heart and you choose to participate in sports, there is a small chance  that a serious problem (including sudden death) could result.  SEEK MEDICAL CARE IF:   You feel that your symptoms are slowly worsening.  You develop any new symptoms that cause concern.  You feel that you are having side effects from any medicines prescribed. SEEK IMMEDIATE MEDICAL CARE IF:   You develop chest pain.  You have shortness of breath.  You notice that your heart  beats irregularly often enough to cause you to worry.  You have fainting spells.  Your symptoms suddenly get worse. Document Released: 12/13/2004 Document Revised: 11/10/2013 Document Reviewed: 07/13/2013 [facility] Patient Information 2015 [place], [place]. This information is not intended to replace advice given to you by your health care provider. Make sure you discuss any questions you have with your health care provider.

## 2015-06-07 NOTE — Assessment & Plan Note (Signed)
Unclear etiology. Patient responded well to clobetasol cream. 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 Discussed options including waiting and biopsy versus continued clobetasol. She elects for continued clobetasol  and recheck in 6 months. I feel this is reasonable.

## 2015-06-07 NOTE — Assessment & Plan Note (Signed)
Echo ordered.

## 2015-06-07 NOTE — Progress Notes (Signed)
Jennifer [name] is a 49 y.o. female who presents to [facility]  today for anal and vaginal pruritus. Symptoms present off and on for months. She was seen and evaluated by OB/GYN who did not make a diagnosis but treated with clobetasol ointment. She additionally is referred to gastroenterology for her anal pruritus referred to dermatology. At this point she is frustrated because she does not have a diagnosis. She notes persistent itching that is well-controlled with clobetasol cream. She is use this intermittently which seems to help a lot. She denies any visible rash or vaginal discharge. She denies any exposure to parasites. No fevers chills nausea vomiting or diarrhea. No chest pains or palpitations. No history of heart murmur.   Past Medical History  Diagnosis Date  . Fibroids    Past Surgical History  Procedure Laterality Date  . Heel spur surgery Right   . Shoulder surgery Right    History  Substance Use Topics  . Smoking status: Never Smoker   . Smokeless tobacco: Never Used  . Alcohol Use: No   ROS as above Medications: Current Outpatient Prescriptions  Medication Sig Dispense Refill  . cetirizine (ZYRTEC) 5 MG tablet Take by mouth.    . fluticasone (FLONASE) 50 MCG/ACT nasal spray Place into both nostrils daily.    . clobetasol cream (TEMOVATE) 0.05 % Apply to affected area BID for 1 week then daily for one week 30 g 5   No current facility-administered medications for this visit.   Allergies  Allergen Reactions  . Hydrocodone-Acetaminophen   . Influenza Vaccines      Exam:  BP 116/71 mmHg  Pulse 65  Ht 5' 6.5" (1.689 m)  Wt 169 lb (76.658 kg)  BMI 26.87 kg/m2  LMP 08/02/2014 Gen: Well NAD HEENT: EOMI,  MMM Lungs: Normal work of breathing. Soft 2/6 systolic murmur nonradiating best heard at the right upper sternal border Heart: RRR no MRG Abd: NABS, Soft. Nondistended, Nontender Exts: Brisk capillary refill, warm and well  perfused.  GYN: External genitalia are normal appearing with no lesions or significant rash. No discharge. The perianal area is also normal appearing.  No results found for this or any previous visit (from the past 24 hour(s)). No results found.   Please see individual assessment and plan sections.

## 2015-06-08 LAB — COMPLETE METABOLIC PANEL WITH GFR
ALBUMIN: 4.2 g/dL (ref 3.5–5.2)
ALT: 11 U/L (ref 0–35)
AST: 18 U/L (ref 0–37)
Alkaline Phosphatase: 72 U/L (ref 39–117)
BILIRUBIN TOTAL: 0.6 mg/dL (ref 0.2–1.2)
BUN: 13 mg/dL (ref 6–23)
CO2: 25 meq/L (ref 19–32)
Calcium: 9.3 mg/dL (ref 8.4–10.5)
Chloride: 104 mEq/L (ref 96–112)
Creat: 0.75 mg/dL (ref 0.50–1.10)
Glucose, Bld: 89 mg/dL (ref 70–99)
POTASSIUM: 4 meq/L (ref 3.5–5.3)
Sodium: 142 mEq/L (ref 135–145)
Total Protein: 7.3 g/dL (ref 6.0–8.3)

## 2015-06-08 LAB — CBC WITH DIFFERENTIAL/PLATELET
BASOS ABS: 0.1 10*3/uL (ref 0.0–0.1)
BASOS PCT: 1 % (ref 0–1)
EOS ABS: 0.3 10*3/uL (ref 0.0–0.7)
EOS PCT: 5 % (ref 0–5)
HCT: 40.3 % (ref 36.0–46.0)
Hemoglobin: 13.6 g/dL (ref 12.0–15.0)
LYMPHS PCT: 42 % (ref 12–46)
Lymphs Abs: 2.5 10*3/uL (ref 0.7–4.0)
MCH: 31.5 pg (ref 26.0–34.0)
MCHC: 33.7 g/dL (ref 30.0–36.0)
MCV: 93.3 fL (ref 78.0–100.0)
MPV: 10.4 fL (ref 8.6–12.4)
Monocytes Absolute: 0.5 10*3/uL (ref 0.1–1.0)
Monocytes Relative: 8 % (ref 3–12)
NEUTROS PCT: 44 % (ref 43–77)
Neutro Abs: 2.6 10*3/uL (ref 1.7–7.7)
Platelets: 224 10*3/uL (ref 150–400)
RBC: 4.32 MIL/uL (ref 3.87–5.11)
RDW: 13.4 % (ref 11.5–15.5)
WBC: 6 10*3/uL (ref 4.0–10.5)

## 2015-06-08 LAB — LIPID PANEL
CHOLESTEROL: 236 mg/dL — AB (ref 0–200)
HDL: 56 mg/dL (ref >=46)
LDL Cholesterol: 159 mg/dL — ABNORMAL HIGH (ref 0–99)
Total CHOL/HDL Ratio: 4.2 Ratio
Triglycerides: 105 mg/dL (ref <150)
VLDL: 21 mg/dL (ref 0–40)

## 2015-06-09 NOTE — Progress Notes (Signed)
Quick Note:  Cholesterol is a bit high but not high enough that we need to start medicines. Work on healthy lifestyle. Recheck in one year. ______

## 2015-06-10 ENCOUNTER — Other Ambulatory Visit: Payer: Self-pay

## 2015-06-10 ENCOUNTER — Ambulatory Visit (HOSPITAL_COMMUNITY): Payer: BLUE CROSS/BLUE SHIELD | Attending: Family Medicine

## 2015-06-10 DIAGNOSIS — I071 Rheumatic tricuspid insufficiency: Secondary | ICD-10-CM | POA: Diagnosis not present

## 2015-06-10 DIAGNOSIS — R010 Benign and innocent cardiac murmurs: Secondary | ICD-10-CM | POA: Insufficient documentation

## 2015-06-10 DIAGNOSIS — I517 Cardiomegaly: Secondary | ICD-10-CM | POA: Diagnosis not present

## 2015-06-10 DIAGNOSIS — R011 Cardiac murmur, unspecified: Secondary | ICD-10-CM

## 2015-06-10 DIAGNOSIS — I351 Nonrheumatic aortic (valve) insufficiency: Secondary | ICD-10-CM | POA: Insufficient documentation

## 2015-06-10 DIAGNOSIS — I340 Nonrheumatic mitral (valve) insufficiency: Secondary | ICD-10-CM | POA: Diagnosis not present

## 2015-07-04 ENCOUNTER — Encounter: Payer: Self-pay | Admitting: Family Medicine

## 2015-08-19 ENCOUNTER — Encounter: Payer: Self-pay | Admitting: Family Medicine

## 2015-08-31 ENCOUNTER — Encounter: Payer: Self-pay | Admitting: Family Medicine

## 2015-08-31 NOTE — Progress Notes (Signed)
Quick Note:  ECHO shows moderate tricuspid regurgitation. This is the cause of the murmur. This is not bad and does not need any treatment. Return as directed., ______

## 2015-12-05 ENCOUNTER — Ambulatory Visit: Payer: BLUE CROSS/BLUE SHIELD | Admitting: Family Medicine

## 2015-12-06 ENCOUNTER — Ambulatory Visit (INDEPENDENT_AMBULATORY_CARE_PROVIDER_SITE_OTHER): Payer: BLUE CROSS/BLUE SHIELD | Admitting: Family Medicine

## 2015-12-06 ENCOUNTER — Encounter: Payer: Self-pay | Admitting: Family Medicine

## 2015-12-06 VITALS — BP 113/70 | HR 55 | Wt 172.0 lb

## 2015-12-06 DIAGNOSIS — R300 Dysuria: Secondary | ICD-10-CM

## 2015-12-06 DIAGNOSIS — R350 Frequency of micturition: Secondary | ICD-10-CM | POA: Diagnosis not present

## 2015-12-06 DIAGNOSIS — L290 Pruritus ani: Secondary | ICD-10-CM | POA: Diagnosis not present

## 2015-12-06 DIAGNOSIS — R1310 Dysphagia, unspecified: Secondary | ICD-10-CM | POA: Diagnosis not present

## 2015-12-06 LAB — COMPREHENSIVE METABOLIC PANEL
ALBUMIN: 4.1 g/dL (ref 3.6–5.1)
ALT: 13 U/L (ref 6–29)
AST: 20 U/L (ref 10–35)
Alkaline Phosphatase: 76 U/L (ref 33–115)
BUN: 15 mg/dL (ref 7–25)
CHLORIDE: 104 mmol/L (ref 98–110)
CO2: 26 mmol/L (ref 20–31)
CREATININE: 0.73 mg/dL (ref 0.50–1.10)
Calcium: 8.9 mg/dL (ref 8.6–10.2)
Glucose, Bld: 81 mg/dL (ref 65–99)
POTASSIUM: 4 mmol/L (ref 3.5–5.3)
Sodium: 140 mmol/L (ref 135–146)
TOTAL PROTEIN: 6.5 g/dL (ref 6.1–8.1)
Total Bilirubin: 0.5 mg/dL (ref 0.2–1.2)

## 2015-12-06 LAB — POCT URINALYSIS DIPSTICK
Bilirubin, UA: NEGATIVE
GLUCOSE UA: NEGATIVE
Ketones, UA: NEGATIVE
Nitrite, UA: NEGATIVE
Protein, UA: NEGATIVE
RBC UA: NEGATIVE
SPEC GRAV UA: 1.025
Urobilinogen, UA: 0.2
pH, UA: 7

## 2015-12-06 LAB — TSH: TSH: 3.149 u[IU]/mL (ref 0.350–4.500)

## 2015-12-06 LAB — T3, FREE: T3, Free: 3.2 pg/mL (ref 2.3–4.2)

## 2015-12-06 LAB — CBC
HEMATOCRIT: 38.9 % (ref 36.0–46.0)
Hemoglobin: 13.3 g/dL (ref 12.0–15.0)
MCH: 32.3 pg (ref 26.0–34.0)
MCHC: 34.2 g/dL (ref 30.0–36.0)
MCV: 94.4 fL (ref 78.0–100.0)
MPV: 10.1 fL (ref 8.6–12.4)
Platelets: 218 10*3/uL (ref 150–400)
RBC: 4.12 MIL/uL (ref 3.87–5.11)
RDW: 13.8 % (ref 11.5–15.5)
WBC: 5.9 10*3/uL (ref 4.0–10.5)

## 2015-12-06 LAB — T4, FREE: FREE T4: 1.13 ng/dL (ref 0.80–1.80)

## 2015-12-06 MED ORDER — NITROFURANTOIN MONOHYD MACRO 100 MG PO CAPS
100.0000 mg | ORAL_CAPSULE | Freq: Two times a day (BID) | ORAL | Status: DC
Start: 2015-12-06 — End: 2015-12-19

## 2015-12-06 NOTE — Patient Instructions (Signed)
Thank you for coming in today. Return in 1 month if not better.  Take macrobid for UTI symptoms.  Get blood work for swallow.

## 2015-12-06 NOTE — Assessment & Plan Note (Signed)
Unclear etiology. This may be a globus sensation or some other issue. Her thyroid is palpable. We'll obtain metabolic panel CBC and thyroid studies to work this issue up. We'll follow for one month. If labs are abnormal or symptoms continue would consider ultrasound of the thyroid or referral to gastroenterology for upper endoscopy.

## 2015-12-06 NOTE — Assessment & Plan Note (Signed)
Improved

## 2015-12-06 NOTE — Assessment & Plan Note (Signed)
Plan for urine culture and trial of MicroBid. Return if not better

## 2015-12-06 NOTE — Progress Notes (Signed)
       Jennifer [name] is a 50 y.o. female who presents to [facility]: [facility] today for urinary frequency and swallowing difficulty.  1) patient has a one-month history of vague intermittent urinary frequency and urgency especially bothersome at bedtime. She denies any vaginal discharge fevers chills nausea vomiting or diarrhea. She's concerned that she may have a urinary tract infection. She has not tried any treatment yet.  2) additionally patient notes a occasional intermittent difficulty swallowing. She notes this tends to worsen when she is anxious. She describes as though she has trouble swallowing fluids as though it sometimes gets stuck. This is very intermittent happening once or twice a month. Typically she can swallow all solids and liquids. She denies any aspiration or vomiting or abdominal pain. She notes her mother has a swallowing difficulty that she describes as the esophagus not working correctly.  3) follow-up anal pruritus. Patient is much improved with clobetasol. She uses it every once in a while a few times a month. She feels much better.   Past Medical History  Diagnosis Date  . Fibroids    Past Surgical History  Procedure Laterality Date  . Heel spur surgery Right   . Shoulder surgery Right    Social History  Substance Use Topics  . Smoking status: Never Smoker   . Smokeless tobacco: Never Used  . Alcohol Use: No   family history includes Cancer in her paternal grandmother; Diabetes in her father; Hyperlipidemia in her mother.  ROS as above Medications: Current Outpatient Prescriptions  Medication Sig Dispense Refill  . cetirizine (ZYRTEC) 5 MG tablet Take by mouth.    . clobetasol cream (TEMOVATE) 0.05 % Apply to affected area BID for 1 week then daily for one week 30 g 5  . fluticasone (FLONASE) 50 MCG/ACT nasal spray Place into both nostrils daily.    .  nitrofurantoin, macrocrystal-monohydrate, (MACROBID) 100 MG capsule Take 1 capsule (100 mg total) by mouth 2 (two) times daily. 14 capsule 0   No current facility-administered medications for this visit.   Allergies  Allergen Reactions  . Hydrocodone-Acetaminophen   . Influenza Vaccines      Exam:  BP 113/70 mmHg  Pulse 55  Wt 172 lb (78.019 kg)  LMP 08/02/2014 Gen: Well NAD nontoxic HEENT: EOMI,  MMM thyroid is palpable and slightly large but no nodules palpated. Normal thyroid motion. Normal voice quality. Normal posterior pharynx. Lungs: Normal work of breathing. CTABL Heart: RRR no MRG Abd: NABS, Soft. Nondistended, Nontender no CV angle tenderness to percussion Exts: Brisk capillary refill, warm and well perfused.   Results for orders placed or performed in visit on 12/06/15 (from the past 24 hour(s))  POCT Urinalysis Dipstick     Status: Abnormal   Collection Time: 12/06/15  8:21 AM  Result Value Ref Range   Color, UA yellow    Clarity, UA clear    Glucose, UA neg    Bilirubin, UA neg    Ketones, UA neg    Spec Grav, UA 1.025    Blood, UA neg    pH, UA 7.0    Protein, UA neg    Urobilinogen, UA 0.2    Nitrite, UA neg    Leukocytes, UA Trace (A) Negative   No results found.   Please see individual assessment and plan sections.

## 2015-12-07 LAB — HIV ANTIBODY (ROUTINE TESTING W REFLEX): HIV 1&2 Ab, 4th Generation: NONREACTIVE

## 2015-12-07 NOTE — Progress Notes (Signed)
Quick Note:  Labs were all normal. I am awaiting urine culture results. ______

## 2015-12-08 ENCOUNTER — Ambulatory Visit: Payer: BLUE CROSS/BLUE SHIELD | Admitting: Family Medicine

## 2015-12-08 LAB — URINE CULTURE
Colony Count: NO GROWTH
Organism ID, Bacteria: NO GROWTH

## 2015-12-14 ENCOUNTER — Encounter: Payer: Self-pay | Admitting: Family Medicine

## 2015-12-14 NOTE — Telephone Encounter (Signed)
Called and spoke with patient. Letter faxed to [phone] per her request. Advised pt that she did not need to come in for an office visit.

## 2015-12-19 ENCOUNTER — Ambulatory Visit (INDEPENDENT_AMBULATORY_CARE_PROVIDER_SITE_OTHER): Payer: BLUE CROSS/BLUE SHIELD | Admitting: Family Medicine

## 2015-12-19 ENCOUNTER — Encounter: Payer: Self-pay | Admitting: Family Medicine

## 2015-12-19 VITALS — BP 125/75 | HR 70 | Wt 172.0 lb

## 2015-12-19 DIAGNOSIS — K1120 Sialoadenitis, unspecified: Secondary | ICD-10-CM

## 2015-12-19 MED ORDER — CEFDINIR 300 MG PO CAPS: 300.0000 mg | ORAL_CAPSULE | Freq: Two times a day (BID) | ORAL | Status: DC

## 2015-12-19 NOTE — Assessment & Plan Note (Signed)
Treatment with NSAIDs warm compress and sour candy sugarfree preferred. Additionally use backup Omnicef antibiotics if symptoms worsen. Return as needed.

## 2015-12-19 NOTE — Patient Instructions (Addendum)
Thank you for coming in today. Use ibuprofen or Aleve for pain. Use a heating pad or warm compress Use sugar-free sour candy  Return as needed. Use antibiotics if you get worse all of a sudden.   Salivary Gland Infection A salivary gland infection is an infection in one or more of the glands that produce spit (saliva). You have six major salivary glands. Each gland has a duct that carries saliva into your mouth. Saliva keeps your mouth moist and breaks down the food that you eat. It also helps to prevent tooth decay. Two salivary glands are located just in front of your ears (parotid). The ducts for these glands open up inside your cheeks, near your back teeth. You also have two glands under your tongue (sublingual) and two glands under your jaw (submandibular). The ducts for these glands open under your tongue. Any salivary gland can become infected. Most infections occur in the parotid glands or submandibular glands. CAUSES Salivary glands can be infected by viruses or bacteria.  The mumps virus is the most common cause of viral salivary gland infections, though mumps is now rare in many areas because of vaccination.  This infection causes swelling in both parotid glands.  Viral infections are more common in children.  The bacteria that cause salivary gland infections are usually the same bacteria that normally live in your mouth.  A stone can form in a salivary gland and block the flow of saliva. As a result, saliva backs up into the salivary gland. Bacteria may then start to grow behind the blockage and cause infection.  Bacterial infections usually cause pain and swelling on one side of the face. Submandibular gland swelling occurs under the jaw. Parotid swelling occurs in front of the ear.  Bacterial infections are more common in adults. RISK FACTORS Children who do not get the MMR (measles, mumps, rubella) vaccine are more likely to get mumps, which can cause a viral salivary  gland infection. Risk factors for bacterial infections include:  Poor dental care (oral hygiene).  Smoking.  Not drinking enough water.  Having a disease that causes dry mouth and dry eyes (Mikulicz syndrome or Sjogren syndrome). SIGNS AND SYMPTOMS The main sign of salivary gland infection is a swollen salivary gland. This type of inflammation is often called sialadenitis. You may have swelling in front of your ear, under your jaw, or under your tongue. Swelling may get worse when you eat and decrease after you eat. Other signs and symptoms include:  Pain.  Tenderness.  Redness.  Dry mouth.  Bad taste in your mouth.  Difficulty chewing and swallowing.  Fever. DIAGNOSIS Your health care provider may suspect a salivary gland infection based on your signs and symptoms. He or she will also do a physical exam. The health care provider will look and feel inside your mouth to see whether a stone is blocking a salivary gland duct. You may need to see an ear, nose, and throat specialist (ENT or otolaryngologist) for diagnosis and treatment. You may also need to have diagnostic tests, such as:  An X-ray to check for a stone.  Other imaging studies to look for an abscess and to rule out other causes of swelling. These tests may include:  Ultrasound.  CT scan.  MRI.  Culture and sensitivity test. This involves collecting a sample of pus for testing in the lab to see what bacteria grow and what antibiotics they are sensitive to. The testing sample may be:  Swabbed from a  salivary gland duct.  Withdrawn from a swollen gland with a needle (aspiration). TREATMENT Viral salivary gland infections usually clear up without treatment. Bacterial infections are usually treated with antibiotic medicine. Severe infections that cause difficulty with swallowing may be treated with an IV antibiotic in the hospital. Other treatments may include:  Probing and widening the salivary duct to allow a  stone to pass. In some cases, a thin, flexible scope (endoscope) may be inserted into the duct to find a stone and remove it.  Breaking up a stone using sound waves.  Draining an infected gland (abscess) with a needle.  In some cases, you may need surgery so your health care provider can:  Remove a stone.  Drain pus from an abscess.  Remove a badly infected gland. HOME CARE INSTRUCTIONS  Take medicines only as directed by your health care provider.  If you were prescribed an antibiotic medicine, finish it all even if you start to feel better.  Follow these instructions every few hours:  Suck on a lemon candy to stimulate the flow of saliva.  Put a warm compress over the gland.  Gently massage the gland.  Drink enough fluid to keep your urine clear or pale yellow.  Rinse your mouth with a mixture of warm water and salt every few hours. To make this mixture, add a pinch of salt to 1 cup of warm water.  Practice good oral hygiene by brushing and flossing your teeth after meals and before you go to bed.  Do not use any tobacco products, including cigarettes, chewing tobacco, or electronic cigarettes. If you need help quitting, ask your health care provider. SEEK MEDICAL CARE IF:  You have pain and swelling in your face, jaw, or mouth after eating.  You have persistent swelling in any of these places:  In front of your ear.  Under your jaw.  Inside your mouth. SEEK IMMEDIATE MEDICAL CARE IF:   You have pain and swelling in your face, jaw, or mouth that are getting worse.  Your pain and swelling make it hard to swallow or breathe.   This information is not intended to replace advice given to you by your health care provider. Make sure you discuss any questions you have with your health care provider.   Document Released: 12/13/2004 Document Revised: 11/26/2014 Document Reviewed: 04/07/2014 Elsevier Interactive Patient Education [employer].

## 2015-12-19 NOTE — Progress Notes (Signed)
       Jennifer [name] is a 50 y.o. female who presents to [facility] today for salivary gland infection. Patient notes pain and swelling in the right posterior cheek. Symptoms are consistent with previous episodes of salivary gland infection or salivary gland stone. She has been using sour candy which helps some. Symptoms present for a few days. Overall she is mildly bothered by her symptoms however she is due to fly to [facility] in a day or 2 is worried that she may worsen. No fevers chills nausea vomiting or diarrhea.   Past Medical History  Diagnosis Date  . Fibroids    Past Surgical History  Procedure Laterality Date  . Heel spur surgery Right   . Shoulder surgery Right    Social History  Substance Use Topics  . Smoking status: Never Smoker   . Smokeless tobacco: Never Used  . Alcohol Use: No   family history includes Cancer in her paternal grandmother; Diabetes in her father; Hyperlipidemia in her mother.  ROS as above Medications: Current Outpatient Prescriptions  Medication Sig Dispense Refill  . cetirizine (ZYRTEC) 5 MG tablet Take by mouth.    . clobetasol cream (TEMOVATE) 0.05 % Apply to affected area BID for 1 week then daily for one week 30 g 5  . fluticasone (FLONASE) 50 MCG/ACT nasal spray Place into both nostrils daily.    . cefdinir (OMNICEF) 300 MG capsule Take 1 capsule (300 mg total) by mouth 2 (two) times daily. 14 capsule 0   No current facility-administered medications for this visit.   Allergies  Allergen Reactions  . Hydrocodone-Acetaminophen   . Influenza Vaccines      Exam:  BP 125/75 mmHg  Pulse 70  Wt 172 lb (78.019 kg)  LMP 08/02/2014 Gen: Well NAD HEENT: EOMI,  MMM right posterior cheek is mildly swollen and mildly tender with no erythema induration or fluctuance. No oral pharynx changes. No tonsil stones visible. Uvula is midline with no  deviation. Lungs: Normal work of breathing. CTABL Heart: RRR no MRG Exts: Brisk capillary refill, warm and well perfused.   No results found for this or any previous visit (from the past 24 hour(s)). No results found.   Please see individual assessment and plan sections.

## 2016-03-16 ENCOUNTER — Encounter: Payer: Self-pay | Admitting: Family Medicine

## 2016-03-16 ENCOUNTER — Ambulatory Visit (INDEPENDENT_AMBULATORY_CARE_PROVIDER_SITE_OTHER): Payer: BLUE CROSS/BLUE SHIELD | Admitting: Family Medicine

## 2016-03-16 VITALS — BP 119/75 | HR 76 | Wt 173.0 lb

## 2016-03-16 DIAGNOSIS — J029 Acute pharyngitis, unspecified: Secondary | ICD-10-CM

## 2016-03-16 MED ORDER — IPRATROPIUM BROMIDE 0.06 % NA SOLN: 2.0000 | NASAL | Status: DC | PRN

## 2016-03-16 NOTE — Patient Instructions (Signed)
Thank you for coming in today. Call or go to the emergency room if you get worse, have trouble breathing, have chest pains, or palpitations.  Use the atrovent nasal spray.  Continue tylenol or iburofen.  We will send in prednisone if not better.   Pharyngitis Pharyngitis is redness, pain, and swelling (inflammation) of your pharynx.  CAUSES  Pharyngitis is usually caused by infection. Most of the time, these infections are from viruses (viral) and are part of a cold. However, sometimes pharyngitis is caused by bacteria (bacterial). Pharyngitis can also be caused by allergies. Viral pharyngitis may be spread from person to person by coughing, sneezing, and personal items or utensils (cups, forks, spoons, toothbrushes). Bacterial pharyngitis may be spread from person to person by more intimate contact, such as kissing.  SIGNS AND SYMPTOMS  Symptoms of pharyngitis include:   Sore throat.   Tiredness (fatigue).   Low-grade fever.   Headache.  Joint pain and muscle aches.  Skin rashes.  Swollen lymph nodes.  Plaque-like film on throat or tonsils (often seen with bacterial pharyngitis). DIAGNOSIS  Your health care provider will ask you questions about your illness and your symptoms. Your medical history, along with a physical exam, is often all that is needed to diagnose pharyngitis. Sometimes, a rapid strep test is done. Other lab tests may also be done, depending on the suspected cause.  TREATMENT  Viral pharyngitis will usually get better in 3-4 days without the use of medicine. Bacterial pharyngitis is treated with medicines that kill germs (antibiotics).  HOME CARE INSTRUCTIONS   Drink enough water and fluids to keep your urine clear or pale yellow.   Only take over-the-counter or prescription medicines as directed by your health care provider:   If you are prescribed antibiotics, make sure you finish them even if you start to feel better.   Do not take aspirin.   Get  lots of rest.   Gargle with 8 oz of salt water ( tsp of salt per 1 qt of water) as often as every 1-2 hours to soothe your throat.   Throat lozenges (if you are not at risk for choking) or sprays may be used to soothe your throat. SEEK MEDICAL CARE IF:   You have large, tender lumps in your neck.  You have a rash.  You cough up green, yellow-brown, or bloody spit. SEEK IMMEDIATE MEDICAL CARE IF:   Your neck becomes stiff.  You drool or are unable to swallow liquids.  You vomit or are unable to keep medicines or liquids down.  You have severe pain that does not go away with the use of recommended medicines.  You have trouble breathing (not caused by a stuffy nose). MAKE SURE YOU:   Understand these instructions.  Will watch your condition.  Will get help right away if you are not doing well or get worse.   This information is not intended to replace advice given to you by your health care provider. Make sure you discuss any questions you have with your health care provider.   Document Released: 11/05/2005 Document Revised: 08/26/2013 Document Reviewed: 07/13/2013 Elsevier Interactive Patient Education [employer].

## 2016-03-16 NOTE — Progress Notes (Signed)
       Jennifer [name] is a 50 y.o. female who presents to [facility] today for sore throat. Patient is a three-day history of left-sided sore throat associated with congestion. She's tried Zicam which has not helped much. She additionally takes cetirizine and Flonase daily. No fevers chills nausea vomiting or diarrhea. No trouble breathing.   Past Medical History  Diagnosis Date  . Fibroids    Past Surgical History  Procedure Laterality Date  . Heel spur surgery Right   . Shoulder surgery Right    Social History  Substance Use Topics  . Smoking status: Never Smoker   . Smokeless tobacco: Never Used  . Alcohol Use: No   family history includes Cancer in her paternal grandmother; Diabetes in her father; Hyperlipidemia in her mother.  ROS as above Medications: Current Outpatient Prescriptions  Medication Sig Dispense Refill  . cetirizine (ZYRTEC) 5 MG tablet Take by mouth.    . clobetasol cream (TEMOVATE) 0.05 % Apply to affected area BID for 1 week then daily for one week 30 g 5  . fluticasone (FLONASE) 50 MCG/ACT nasal spray Place into both nostrils daily.    Marland Kitchen ipratropium (ATROVENT) 0.06 % nasal spray Place 2 sprays into both nostrils every 4 (four) hours as needed for rhinitis. 10 mL 6   No current facility-administered medications for this visit.   Allergies  Allergen Reactions  . Hydrocodone-Acetaminophen   . Influenza Vaccines      Exam:  BP 119/75 mmHg  Pulse 76  Wt 173 lb (78.472 kg)  LMP 08/02/2014 Gen: Well NAD HEENT: EOMI,  MMM Posterior pharynx is nonerythematous however there is cobblestoning present. Normal-appearing tonsils without exudate. Tympanic membranes are normal. Clear nasal discharge is present. Lungs: Normal work of breathing. CTABL Heart: RRR no MRG Abd: NABS, Soft. Nondistended, Nontender Exts: Brisk capillary refill, warm and well perfused.    No results found for this or any previous visit (from the past 24 hour(s)). No results found.   50 year old woman with viral pharyngitis/URI. Discussed options. Patient declined prednisone however we will prescribe dose if her symptoms are not improved or worsening. Additionally we'll use Atrovent nasal spray and Tylenol or ibuprofen. Continue Zyrtec and Flonase. Recheck as needed.

## 2016-05-30 ENCOUNTER — Ambulatory Visit: Payer: BLUE CROSS/BLUE SHIELD | Admitting: Family Medicine

## 2016-05-30 ENCOUNTER — Ambulatory Visit (INDEPENDENT_AMBULATORY_CARE_PROVIDER_SITE_OTHER): Payer: BLUE CROSS/BLUE SHIELD | Admitting: Family Medicine

## 2016-05-30 ENCOUNTER — Encounter: Payer: Self-pay | Admitting: Family Medicine

## 2016-05-30 VITALS — BP 112/75 | HR 55 | Wt 171.0 lb

## 2016-05-30 DIAGNOSIS — M7042 Prepatellar bursitis, left knee: Secondary | ICD-10-CM

## 2016-05-30 DIAGNOSIS — Z780 Asymptomatic menopausal state: Secondary | ICD-10-CM

## 2016-05-30 DIAGNOSIS — E7800 Pure hypercholesterolemia, unspecified: Secondary | ICD-10-CM

## 2016-05-30 DIAGNOSIS — L290 Pruritus ani: Secondary | ICD-10-CM | POA: Diagnosis not present

## 2016-05-30 DIAGNOSIS — L299 Pruritus, unspecified: Secondary | ICD-10-CM | POA: Diagnosis not present

## 2016-05-30 DIAGNOSIS — Z1239 Encounter for other screening for malignant neoplasm of breast: Secondary | ICD-10-CM

## 2016-05-30 MED ORDER — CLOBETASOL PROPIONATE 0.05 % EX CREA: TOPICAL_CREAM | CUTANEOUS | Status: DC

## 2016-05-30 MED ORDER — DICLOFENAC SODIUM 1 % TD GEL: 4.0000 g | Freq: Four times a day (QID) | TRANSDERMAL | Status: DC

## 2016-05-30 NOTE — Patient Instructions (Signed)
Thank you for coming in today. Get fasting labs soon.  Get mammogram soon.  Return in November after your birthday for a well visit.   Use voltaren gel for knee pain.  Continue ice as needed.   Prepatellar Bursitis With Rehab  Bursitis is a condition that is characterized by inflammation of a bursa. Saunders Revel exists in many areas of the body. They are fluid-filled sacs that lie between a soft tissue (skin, tendon, or ligament) and a bone, and they reduce friction between the structures as well as the stress placed on the soft tissue. Prepatellar bursitis is inflammation of the bursa that lies between the skin and the kneecap (patella). This condition often causes pain over the patella. SYMPTOMS   Pain, tenderness, and/or inflammation over the patella.  Pain that worsens with movement of the knee joint.  Decreased range of motion for the knee joint.  A crackling sound (crepitation) when the bursa is moved or touched.  Occasionally, painless swelling of the bursa.  Fever (when infected). CAUSES  Bursitis is caused by damage to the bursa, which results in an inflammatory response. Common mechanisms of injury include:  Direct trauma to the front of the knee.  Repetitive and/or stressful use of the knee. RISK INCREASES WITH:  Activities in which kneeling and/or falling on one's knees is likely (volleyball or football).  Repetitive and stressful training, especially if it involves running on hills.  Improper training techniques, such as a sudden increase in the intensity, frequency, or duration of training.  Failure to warm up properly before activity.  Poor technique.  Artificial turf. PREVENTION   Avoid kneeling or falling on your knees.  Warm up and stretch properly before activity.  Allow for adequate recovery between workouts.  Maintain physical fitness:  Strength, flexibility, and endurance.  Cardiovascular fitness.  Learn and use proper technique. When possible,  have a coach correct improper technique.  Wear properly fitted and padded protective equipment (knee pads). PROGNOSIS  If treated properly, then the symptoms of prepatellar bursitis usually resolve within 2 weeks. RELATED COMPLICATIONS   Recurrent symptoms that result in a chronic problem.  Prolonged healing time, if improperly treated or reinjured.  Limited range of motion.  Infection of bursa.  Chronic inflammation or scarring of bursa. TREATMENT  Treatment initially involves the use of ice and medication to help reduce pain and inflammation. The use of strengthening and stretching exercises may help reduce pain with activity, especially those of the quadriceps and hamstring muscles. These exercises may be performed at home or with referral to a therapist. Your caregiver may recommend knee pads when you return to playing sports, in order to reduce the stress on the prepatellar bursa. If symptoms persist despite treatment, then your caregiver may drain fluid out with a needle (aspirate) the bursa. If symptoms persist for greater than 6 months despite nonsurgical (conservative) treatment, then surgery may be recommended to remove the bursa.  MEDICATION  If pain medication is necessary, then nonsteroidal anti-inflammatory medications, such as aspirin and ibuprofen, or other minor pain relievers, such as acetaminophen, are often recommended.  Do not take pain medication for 7 days before surgery.  Prescription pain relievers may be given if deemed necessary by your caregiver. Use only as directed and only as much as you need.  Corticosteroid injections may be given by your caregiver. These injections should be reserved for the most serious cases, because they may only be given a certain number of times. HEAT AND COLD  Cold treatment (  icing) relieves pain and reduces inflammation. Cold treatment should be applied for 10 to 15 minutes every 2 to 3 hours for inflammation and pain and  immediately after any activity that aggravates your symptoms. Use ice packs or massage the area with a piece of ice (ice massage).  Heat treatment may be used prior to performing the stretching and strengthening activities prescribed by your caregiver, physical therapist, or athletic trainer. Use a heat pack or soak the injury in warm water. SEEK MEDICAL CARE IF:  Treatment seems to offer no benefit, or the condition worsens.  Any medications produce adverse side effects. EXERCISES RANGE OF MOTION (ROM) AND STRETCHING EXERCISES - Prepatellar Bursitis These exercises may help you when beginning to rehabilitate your injury. Your symptoms may resolve with or without further involvement from your physician, physical therapist or athletic trainer. While completing these exercises, remember:   Restoring tissue flexibility helps normal motion to return to the joints. This allows healthier, less painful movement and activity.  An effective stretch should be held for at least 30 seconds.  A stretch should never be painful. You should only feel a gentle lengthening or release in the stretched tissue. STRETCH - Hamstrings, Standing  Stand or sit and extend your right / left leg, placing your foot on a chair or foot stool  Keeping a slight arch in your low back and your hips straight forward.  Lead with your chest and lean forward at the waist until you feel a gentle stretch in the back of your right / left knee or thigh. (When done correctly, this exercise requires leaning only a small distance.)  Hold this position for __________ seconds. Repeat __________ times. Complete this stretch __________ times per day. STRETCH - Quadriceps, Prone   Lie on your stomach on a firm surface, such as a bed or padded floor.  Bend your right / left knee and grasp your ankle. If you are unable to reach, your ankle or pant leg, use a belt around your foot to lengthen your reach.  Gently pull your heel toward  your buttocks. Your knee should not slide out to the side. You should feel a stretch in the front of your thigh and/or knee.  Hold this position for __________ seconds. Repeat __________ times. Complete this stretch __________ times per day.  STRETCH - Hamstrings/Adductors, V-Sit   Sit on the floor with your legs extended in a large "V," keeping your knees straight.  With your head and chest upright, bend at your waist reaching for your right foot to stretch your left adductors.  You should feel a stretch in your left inner thigh. Hold for __________ seconds.  Return to the upright position to relax your leg muscles.  Continuing to keep your chest upright, bend straight forward at your waist to stretch your hamstrings.  You should feel a stretch behind both of your thighs and/or knees. Hold for __________ seconds.  Return to the upright position to relax your leg muscles.  Repeat steps 2 through 4. Repeat __________ times. Complete this exercise __________ times per day.  STRENGTHENING EXERCISES - Prepatellar Bursitis  These exercises may help you when beginning to rehabilitate your injury. They may resolve your symptoms with or without further involvement from your physician, physical therapist or athletic trainer. While completing these exercises, remember:  Muscles can gain both the endurance and the strength needed for everyday activities through controlled exercises.  Complete these exercises as instructed by your physician, physical therapist or athletic trainer.  Progress the resistance and repetitions only as guided. STRENGTH - Quadriceps, Isometrics  Lie on your back with your right / left leg extended and your opposite knee bent.  Gradually tense the muscles in the front of your right / left thigh. You should see either your kneecap slide up toward your hip or increased dimpling just above the knee. This motion will push the back of the knee down toward the floor/mat/bed on  which you are lying.  Hold the muscle as tight as you can without increasing your pain for __________ seconds.  Relax the muscles slowly and completely in between each repetition. Repeat __________ times. Complete this exercise __________ times per day.  STRENGTH - Quadriceps, Short Arcs   Lie on your back. Place a __________ inch towel roll under your knee so that the knee slightly bends.  Raise only your lower leg by tightening the muscles in the front of your thigh. Do not allow your thigh to rise.  Hold this position for __________ seconds. Repeat __________ times. Complete this exercise __________ times per day.  OPTIONAL ANKLE WEIGHTS: Begin with ____________________, but DO NOT exceed ____________________. Increase in1 lb/0.5 kg increments.  STRENGTH - Quadriceps, Straight Leg Raises  Quality counts! Watch for signs that the quadriceps muscle is working to insure you are strengthening the correct muscles and not "cheating" by substituting with healthier muscles.  Lay on your back with your right / left leg extended and your opposite knee bent.  Tense the muscles in the front of your right / left thigh. You should see either your kneecap slide up or increased dimpling just above the knee. Your thigh may even quiver.  Tighten these muscles even more and raise your leg 4 to 6 inches off the floor. Hold for __________ seconds.  Keeping these muscles tense, lower your leg.  Relax the muscles slowly and completely in between each repetition. Repeat __________ times. Complete this exercise __________ times per day.  STRENGTH - Quadriceps, Step-Ups   Use a thick book, step or step stool that is __________ inches tall.  Holding a wall or counter for balance only, not support.  Slowly step-up with your right / left foot, keeping your knee in line with your hip and foot. Do not allow your knee to bend so far that you cannot see your toes.  Slowly unlock your knee and lower yourself to  the starting position. Your muscles, not gravity, should lower you. Repeat __________ times. Complete this exercise __________ times per day.   This information is not intended to replace advice given to you by your health care provider. Make sure you discuss any questions you have with your health care provider.   Document Released: 11/05/2005 Document Revised: 07/27/2015 Document Reviewed: 02/17/2009 Elsevier Interactive Patient Education [employer].

## 2016-05-30 NOTE — Progress Notes (Signed)
       Jennifer [name] is a 50 y.o. female who presents to [facility]: [place] today for fall and left knee injury.  Patient tripped and fell landing on both anterior knees. She notes continued mild left anterior knee pain. Pain is worse when she stands from a seated position and when she kneels on her knee. She notes some swelling on the anterior portion of the knee. She denies any fevers or chills nausea vomiting or diarrhea. She has not tried any treatment yet.  Perianal pruritus: Patient is doing quite well with intermittent clobetasol. She notes she needs a refill of this.  Health maintenance:: Patient notes she's not had a mammogram in several years. She would like an order for mammogram today. Patient is up-to-date with cervical cancer and colon cancer screening Vaccines up-to-date with the exception of influenza when she is declined.   Past Medical History  Diagnosis Date  . Fibroids    Past Surgical History  Procedure Laterality Date  . Heel spur surgery Right   . Shoulder surgery Right    Social History  Substance Use Topics  . Smoking status: Never Smoker   . Smokeless tobacco: Never Used  . Alcohol Use: No   family history includes Cancer in her paternal grandmother; Diabetes in her father; Hyperlipidemia in her mother.  ROS as above:  Medications: Current Outpatient Prescriptions  Medication Sig Dispense Refill  . cetirizine (ZYRTEC) 5 MG tablet Take by mouth.    . clobetasol cream (TEMOVATE) 0.05 % Apply to affected area BID for 1 week then daily for one week 30 g 5  . diclofenac sodium (VOLTAREN) 1 % GEL Apply 4 g topically 4 (four) times daily. To affected joint. 100 g 11  . fluticasone (FLONASE) 50 MCG/ACT nasal spray Place into both nostrils daily.     No current facility-administered medications for this visit.   Allergies  Allergen Reactions  .  Hydrocodone-Acetaminophen   . Influenza Vaccines      Exam:  BP 112/75 mmHg  Pulse 55  Wt 171 lb (77.565 kg)  LMP 08/02/2014 Gen: Well NAD HEENT: EOMI,  MMM Lungs: Normal work of breathing. CTABL Heart: RRR no MRG Abd: NABS, Soft. Nondistended, Nontender Exts: Brisk capillary refill, warm and well perfused.  Left knee: Slight abrasion present anteriorly. Mild swelling on the anterior aspect of the knee. No significant erythema or induration or exudate or discharge. Tender palpation mildly over the patella tendon and inferior portion of the patella. Normal knee motion Intact extension strength Stable ligamentous exam Normal gait.  No results found for this or any previous visit (from the past 24 hour(s)). No results found.    Assessment and Plan: 50 y.o. female with   Left knee pain: Prepatellar bursitis. Very doubtful fracture. Joint decision to defer x-ray at this time. Plan for rest ice and diclofenac gel. Return if not better in a few weeks.  Clobetasol refilled for perianal pruritus  Mammogram ordered for breast cancer screening  Basic fasting labs ordered to follow along elevated LDL one year ago.  Discussed warning signs or symptoms. Please see discharge instructions. Patient expresses understanding.

## 2016-06-01 ENCOUNTER — Ambulatory Visit: Payer: Self-pay | Admitting: Family Medicine

## 2016-06-06 ENCOUNTER — Ambulatory Visit (INDEPENDENT_AMBULATORY_CARE_PROVIDER_SITE_OTHER): Payer: BLUE CROSS/BLUE SHIELD

## 2016-06-06 DIAGNOSIS — Z1231 Encounter for screening mammogram for malignant neoplasm of breast: Secondary | ICD-10-CM | POA: Diagnosis not present

## 2016-06-06 DIAGNOSIS — Z1239 Encounter for other screening for malignant neoplasm of breast: Secondary | ICD-10-CM

## 2016-06-06 DIAGNOSIS — Z780 Asymptomatic menopausal state: Secondary | ICD-10-CM

## 2016-06-07 NOTE — Progress Notes (Signed)
Quick Note:  Mammogram was normal. Your good for 1-2 years. ______

## 2016-06-14 LAB — CBC
HCT: 40.1 % (ref 35.0–45.0)
Hemoglobin: 13.5 g/dL (ref 11.7–15.5)
MCH: 31.3 pg (ref 27.0–33.0)
MCHC: 33.7 g/dL (ref 32.0–36.0)
MCV: 92.8 fL (ref 80.0–100.0)
MPV: 10.8 fL (ref 7.5–12.5)
PLATELETS: 229 10*3/uL (ref 140–400)
RBC: 4.32 MIL/uL (ref 3.80–5.10)
RDW: 13.4 % (ref 11.0–15.0)
WBC: 6.4 10*3/uL (ref 3.8–10.8)

## 2016-06-14 LAB — HEMOGLOBIN A1C
Hgb A1c MFr Bld: 5.9 % — ABNORMAL HIGH (ref <5.7)
Mean Plasma Glucose: 123 mg/dL

## 2016-06-15 ENCOUNTER — Encounter: Payer: Self-pay | Admitting: Family Medicine

## 2016-06-15 DIAGNOSIS — R7303 Prediabetes: Secondary | ICD-10-CM | POA: Insufficient documentation

## 2016-06-15 LAB — LIPID PANEL
CHOLESTEROL: 213 mg/dL — AB (ref 125–200)
HDL: 56 mg/dL (ref >=46)
LDL CALC: 121 mg/dL (ref <130)
TRIGLYCERIDES: 179 mg/dL — AB (ref <150)
Total CHOL/HDL Ratio: 3.8 Ratio (ref <=5.0)
VLDL: 36 mg/dL — AB (ref <30)

## 2016-06-15 LAB — COMPREHENSIVE METABOLIC PANEL
ALBUMIN: 4.5 g/dL (ref 3.6–5.1)
ALT: 12 U/L (ref 6–29)
AST: 19 U/L (ref 10–35)
Alkaline Phosphatase: 76 U/L (ref 33–115)
BILIRUBIN TOTAL: 0.7 mg/dL (ref 0.2–1.2)
BUN: 15 mg/dL (ref 7–25)
CALCIUM: 9.2 mg/dL (ref 8.6–10.2)
CHLORIDE: 104 mmol/L (ref 98–110)
CO2: 28 mmol/L (ref 20–31)
Creat: 0.83 mg/dL (ref 0.50–1.10)
Glucose, Bld: 84 mg/dL (ref 65–99)
Potassium: 4.3 mmol/L (ref 3.5–5.3)
Sodium: 142 mmol/L (ref 135–146)
TOTAL PROTEIN: 6.9 g/dL (ref 6.1–8.1)

## 2016-06-15 LAB — TSH: TSH: 3.74 m[IU]/L

## 2016-06-15 LAB — VITAMIN D 25 HYDROXY (VIT D DEFICIENCY, FRACTURES): Vit D, 25-Hydroxy: 52 ng/mL (ref 30–100)

## 2016-06-19 ENCOUNTER — Ambulatory Visit: Payer: BLUE CROSS/BLUE SHIELD | Admitting: Obstetrics & Gynecology

## 2016-06-21 ENCOUNTER — Encounter: Payer: Self-pay | Admitting: Family Medicine

## 2016-06-21 NOTE — Telephone Encounter (Signed)
Patient called and would like to know what is the next step since she is still having Left knee pain? Please call patient and let her know what Dr.Corey advises?  Call her on [phone]

## 2016-06-25 ENCOUNTER — Encounter: Payer: Self-pay | Admitting: Family Medicine

## 2016-06-25 ENCOUNTER — Ambulatory Visit (INDEPENDENT_AMBULATORY_CARE_PROVIDER_SITE_OTHER): Payer: BLUE CROSS/BLUE SHIELD | Admitting: Family Medicine

## 2016-06-25 DIAGNOSIS — M7042 Prepatellar bursitis, left knee: Secondary | ICD-10-CM | POA: Insufficient documentation

## 2016-06-25 NOTE — Progress Notes (Signed)
Jennifer [name] is a 50 y.o. female who presents to [place]: [place] today for follow-up left knee pain. Patient was seen in early July for left knee pain and swelling thought to be related to traumatic prepatellar bursitis. She was treated with a close the neck gel and rest. She notes overall her symptoms have improved but she has a painful nodule on the anterior aspect of the left knee. This is painful with knee extension while wearing trousers and kneeling and getting in and out of her car. Symptoms are bothersome and interfering with quality of life. No fevers or chills vomiting or diarrhea.   Past Medical History:  Diagnosis Date  . Fibroids    Past Surgical History:  Procedure Laterality Date  . HEEL SPUR SURGERY Right   . SHOULDER SURGERY Right    Social History  Substance Use Topics  . Smoking status: Never Smoker  . Smokeless tobacco: Never Used  . Alcohol use No   family history includes Cancer in her paternal grandmother; Diabetes in her father; Hyperlipidemia in her mother.  ROS as above:  Medications: Current Outpatient Prescriptions  Medication Sig Dispense Refill  . cetirizine (ZYRTEC) 5 MG tablet Take by mouth.    . clobetasol cream (TEMOVATE) 0.05 % Apply to affected area BID for 1 week then daily for one week 30 g 5  . diclofenac sodium (VOLTAREN) 1 % GEL Apply 4 g topically 4 (four) times daily. To affected joint. 100 g 11  . fluticasone (FLONASE) 50 MCG/ACT nasal spray Place into both nostrils daily.     No current facility-administered medications for this visit.    Allergies  Allergen Reactions  . Hydrocodone-Acetaminophen   . Influenza Vaccines      Exam:  BP 109/71   Pulse 67   Wt 172 lb (78 kg)   LMP 08/02/2014   BMI 27.35 kg/m  Gen: Well NAD Left knee: Small 1 cm nodule at the superior border of the patella. Minimally tender to  touch with no erythema or discharge. Normal knee motion. Stable knee exam.  Limited musculoskeletal ultrasound of the left knee nodule shows hypoechoic fluid change just superficial to the patella and quadriceps tendon. No increased vascular activity. Normal bony appearance. Impression: Prepatellar bursitis  Procedure: Real-time Ultrasound Guided Injection of left knee prepatellar bursitis  Device: GE Logiq E  Images permanently stored and available for review in the ultrasound unit. Verbal informed consent obtained. Discussed risks and benefits of procedure. Warned about infection bleeding damage to structures skin hypopigmentation and fat atrophy among others. Patient expresses understanding and agreement Time-out conducted.  Noted no overlying erythema, induration, or other signs of local infection.  Skin prepped in a sterile fashion.  Local anesthesia: Topical Ethyl chloride.  With sterile technique and under real time ultrasound guidance: 2.5 mg of dexamethasone and 0.25 mL of Marcaine injected easily.  Completed without difficulty  Pain immediately resolved suggesting accurate placement of the medication.  Advised to call if fevers/chills, erythema, induration, drainage, or persistent bleeding.  Images permanently stored and available for review in the ultrasound unit.  Impression: Technically successful ultrasound guided injection. Ultrasound guidance needed to avoid the quadriceps tendon and superficial skin to avoid risk of fat atrophy skin hypopigmentation and tendon disruption   No results found for this or any previous visit (from the past 24 hour(s)). No results found.    Assessment and Plan: 50 y.o. female with left knee  prepatellar bursitis failing conservative management. Treated today with injection. Return in one month if not better.   No orders of the defined types were placed in this encounter.   Discussed warning signs or symptoms. Please see  discharge instructions. Patient expresses understanding.

## 2016-06-25 NOTE — Patient Instructions (Signed)
Thank you for coming in today. Call or go to the ER if you develop a large red swollen joint with extreme pain or oozing puss.  Return in 1 month if not better.

## 2016-07-22 IMAGING — MG DIGITAL SCREENING BILATERAL MAMMOGRAM WITH CAD
5 series · 5 of 5 positions shown · non-contrast
Comparison: Previous exam(s).

CLINICAL DATA: Screening.

EXAM:
DIGITAL SCREENING BILATERAL MAMMOGRAM WITH CAD

[R CC]
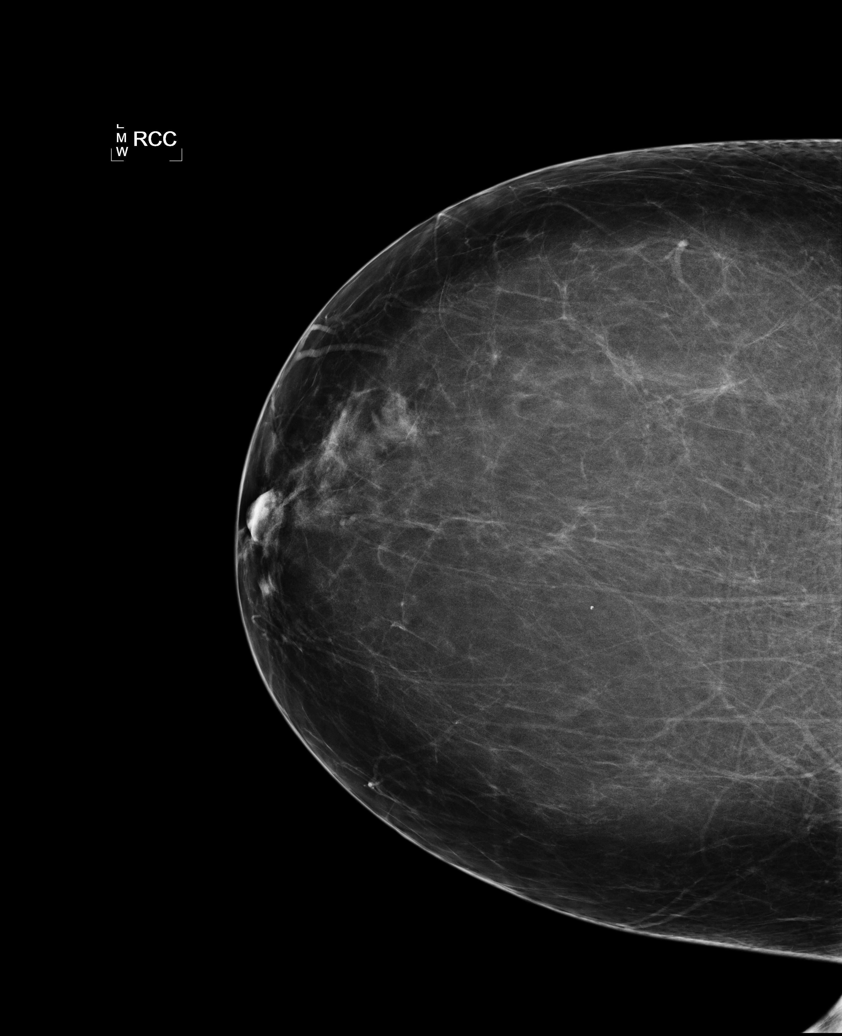

[L CC]
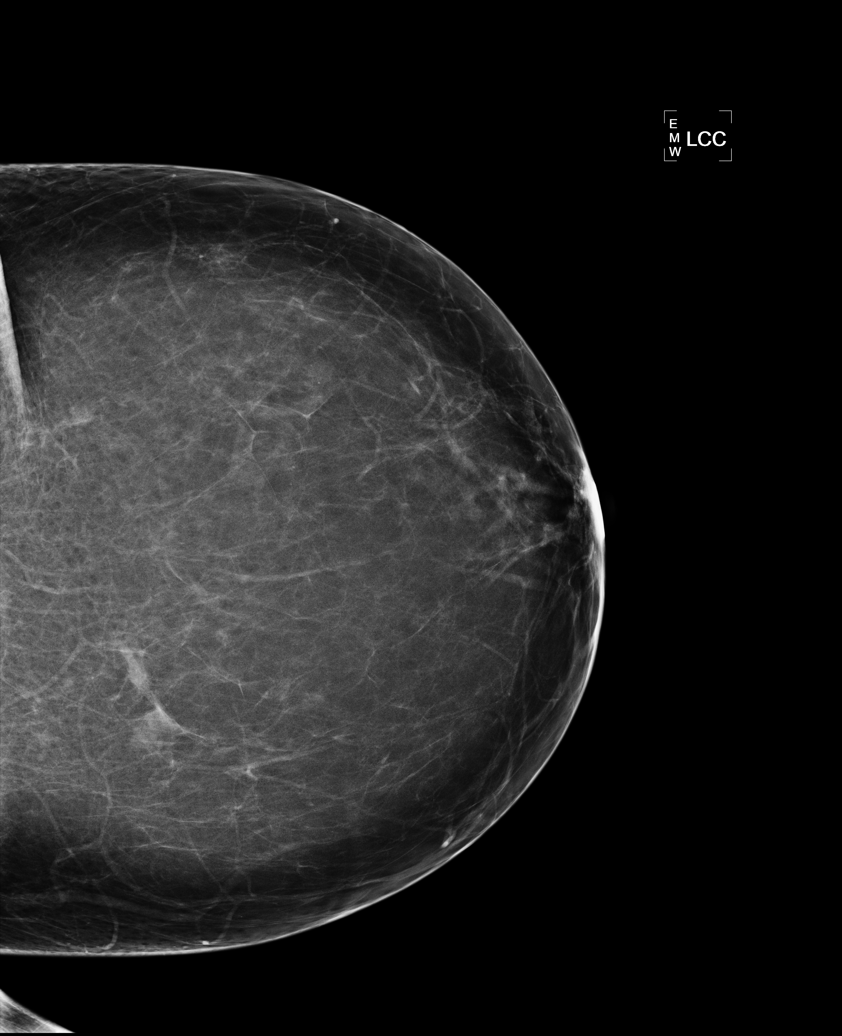

[L MLO]
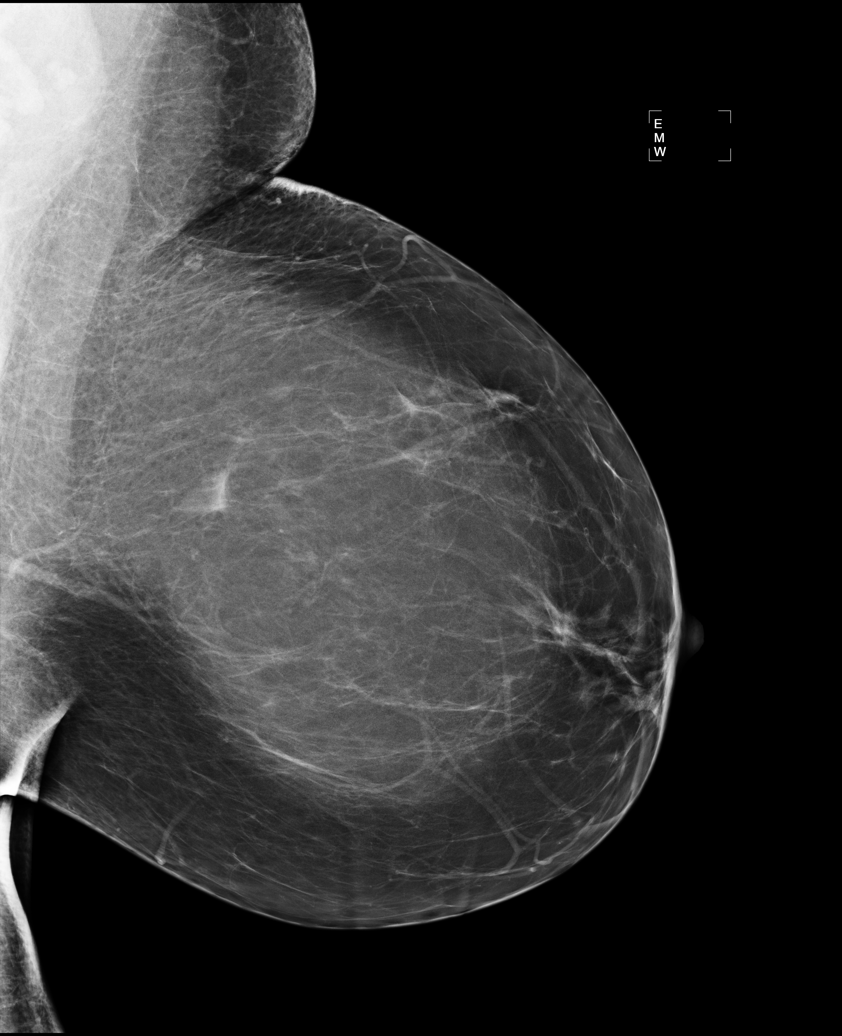

[R MLO]
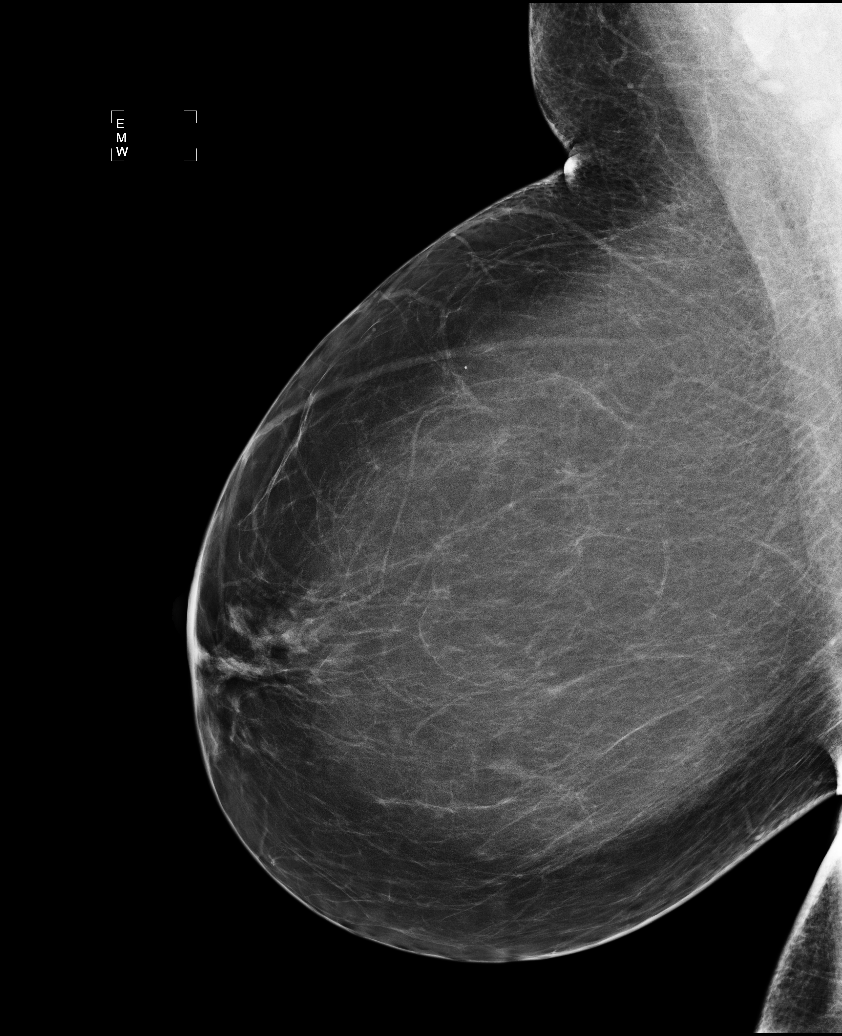

[L XCCL]
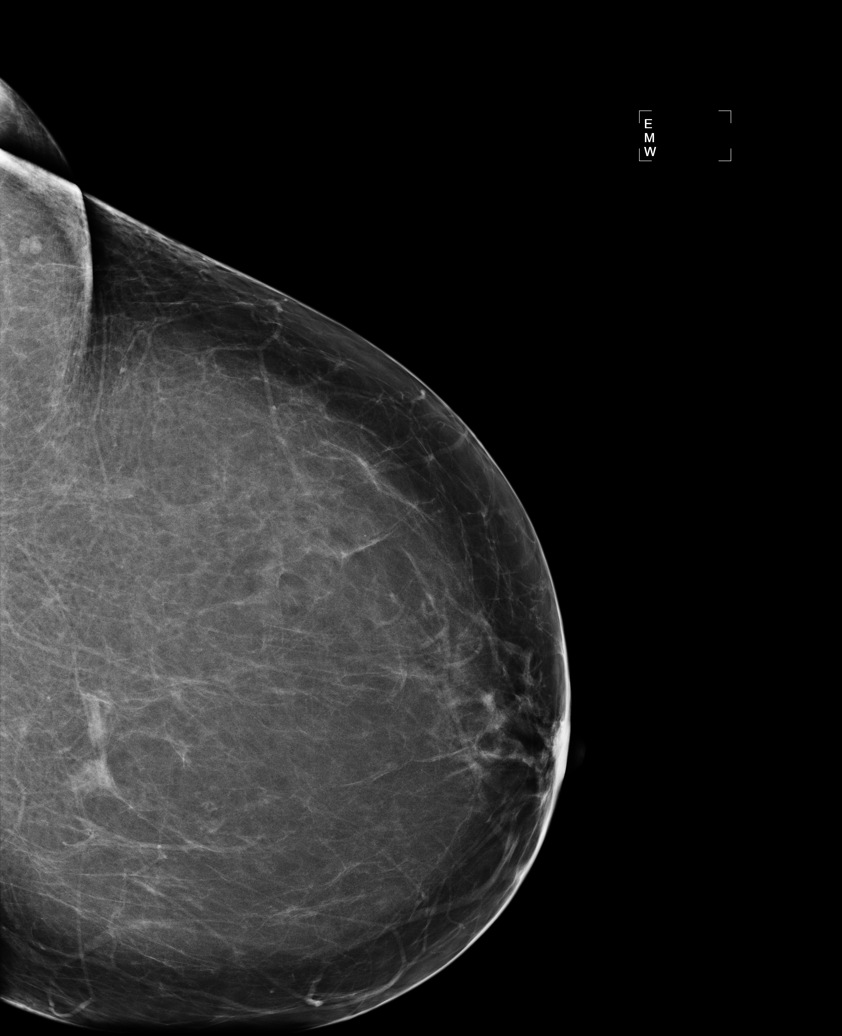

[5 of 5 positions shown; findings below may reference images not displayed]

ACR Breast Density Category b: There are scattered areas of
fibroglandular density.
FINDINGS: There are no findings suspicious for malignancy. Images were
processed with CAD.
IMPRESSION: No mammographic evidence of malignancy. A result letter of this
screening mammogram will be mailed directly to the patient.

RECOMMENDATION:
Screening mammogram in one year. (Code:AS-G-LCT)

BI-RADS CATEGORY  1: Negative.

## 2016-07-30 ENCOUNTER — Ambulatory Visit (INDEPENDENT_AMBULATORY_CARE_PROVIDER_SITE_OTHER): Payer: BLUE CROSS/BLUE SHIELD | Admitting: Family Medicine

## 2016-07-30 ENCOUNTER — Encounter: Payer: Self-pay | Admitting: Family Medicine

## 2016-07-30 VITALS — BP 112/66 | HR 73 | Wt 169.0 lb

## 2016-07-30 DIAGNOSIS — M7042 Prepatellar bursitis, left knee: Secondary | ICD-10-CM | POA: Diagnosis not present

## 2016-07-30 NOTE — Patient Instructions (Signed)
Thank you for coming in today. Continue watchful waiting.  Try using a corn pad for pain as needed.  Return in 1-3 months if not better.    Ganglion Cyst A ganglion cyst is a noncancerous, fluid-filled lump that occurs near joints or tendons. The ganglion cyst grows out of a joint or the lining of a tendon. It most often develops in the hand or wrist, but it can also develop in the shoulder, elbow, hip, knee, ankle, or foot. The round or oval ganglion cyst can be the size of a pea or larger than a grape. Increased activity may enlarge the size of the cyst because more fluid starts to build up.  CAUSES It is not known what causes a ganglion cyst to grow. However, it may be related to:  Inflammation or irritation around the joint.  An injury.  Repetitive movements or overuse.  Arthritis. RISK FACTORS Risk factors include:  Being a woman.  Being age 79-50. SIGNS AND SYMPTOMS Symptoms may include:   A lump. This most often appears on the hand or wrist, but it can occur in other areas of the body.  Tingling.  Pain.  Numbness.  Muscle weakness.  Weak grip.  Less movement in a joint. DIAGNOSIS Ganglion cysts are most often diagnosed based on a physical exam. Your health care provider will feel the lump and may shine a light alongside it. If it is a ganglion cyst, a light often shines through it. Your health care provider may order an X-ray, ultrasound, or MRI to rule out other conditions. TREATMENT Ganglion cysts usually go away on their own without treatment. If pain or other symptoms are involved, treatment may be needed. Treatment is also needed if the ganglion cyst limits your movement or if it gets infected. Treatment may include:  Wearing a brace or splint on your wrist or finger.  Taking anti-inflammatory medicine.  Draining fluid from the lump with a needle (aspiration).  Injecting a steroid into the joint.  Surgery to remove the ganglion cyst. HOME CARE  INSTRUCTIONS  Do not press on the ganglion cyst, poke it with a needle, or hit it.  Take medicines only as directed by your health care provider.  Wear your brace or splint as directed by your health care provider.  Watch your ganglion cyst for any changes.  Keep all follow-up visits as directed by your health care provider. This is important. SEEK MEDICAL CARE IF:  Your ganglion cyst becomes larger or more painful.  You have increased redness, red streaks, or swelling.  You have pus coming from the lump.  You have weakness or numbness in the affected area.  You have a fever or chills.   This information is not intended to replace advice given to you by your health care provider. Make sure you discuss any questions you have with your health care provider.   Document Released: 11/02/2000 Document Revised: 11/26/2014 Document Reviewed: 04/20/2014 Elsevier Interactive Patient Education [employer].

## 2016-07-30 NOTE — Progress Notes (Signed)
       Jennifer [name] is a 50 y.o. female who presents to [facility]: [place] today for left knee mass. Patient has been evaluated and treated several times for left knee prepatellar bursitis due to trauma. She had a steroid injection about a month ago that helped. She notes that bump on the front part of her knee is still tender and still present but is improved. She denies any fevers or chills.   Past Medical History:  Diagnosis Date  . Fibroids    Past Surgical History:  Procedure Laterality Date  . HEEL SPUR SURGERY Right   . SHOULDER SURGERY Right    Social History  Substance Use Topics  . Smoking status: Never Smoker  . Smokeless tobacco: Never Used  . Alcohol use No   family history includes Cancer in her paternal grandmother; Diabetes in her father; Hyperlipidemia in her mother.  ROS as above:  Medications: Current Outpatient Prescriptions  Medication Sig Dispense Refill  . cetirizine (ZYRTEC) 5 MG tablet Take by mouth.    . clobetasol cream (TEMOVATE) 0.05 % Apply to affected area BID for 1 week then daily for one week 30 g 5  . diclofenac sodium (VOLTAREN) 1 % GEL Apply 4 g topically 4 (four) times daily. To affected joint. 100 g 11  . fluticasone (FLONASE) 50 MCG/ACT nasal spray Place into both nostrils daily.     No current facility-administered medications for this visit.    Allergies  Allergen Reactions  . Hydrocodone-Acetaminophen   . Influenza Vaccines      Exam:  BP 112/66   Pulse 73   Wt 169 lb (76.7 kg)   LMP 08/02/2014   BMI 26.87 kg/m  Gen: Well NAD Left knee: Small less than 1 cm mildly tender firm nodule anterior knee. No skin erythema or induration.  Limited musculoskeletal ultrasound shows a small superficial cystic structure measuring 0.77 cm x 0.17 cm. No Doppler flow. Normal bony appearance.  No results found for this or any  previous visit (from the past 24 hour(s)). No results found.    Assessment and Plan: 50 y.o. female with ganglion cyst versus traumatic prepatellar bursitis. Improved following injection but still present. Plan for watchful waiting repeat injection in 3 months if not improved. Return sooner if worsening.   No orders of the defined types were placed in this encounter.   Discussed warning signs or symptoms. Please see discharge instructions. Patient expresses understanding.

## 2016-08-29 ENCOUNTER — Encounter: Payer: Self-pay | Admitting: Family Medicine

## 2016-09-30 ENCOUNTER — Encounter (HOSPITAL_COMMUNITY): Payer: Self-pay

## 2016-10-01 ENCOUNTER — Ambulatory Visit (INDEPENDENT_AMBULATORY_CARE_PROVIDER_SITE_OTHER): Payer: BLUE CROSS/BLUE SHIELD | Admitting: Family Medicine

## 2016-10-01 ENCOUNTER — Encounter: Payer: Self-pay | Admitting: Family Medicine

## 2016-10-01 VITALS — BP 114/66 | HR 54 | Wt 167.0 lb

## 2016-10-01 DIAGNOSIS — Z0000 Encounter for general adult medical examination without abnormal findings: Secondary | ICD-10-CM | POA: Diagnosis not present

## 2016-10-01 NOTE — Patient Instructions (Signed)
Thank you for coming in today.  Return in 1 year or sooner if needed.  Work ion reducing carbs.  Continue weight bearing activity.

## 2016-10-01 NOTE — Progress Notes (Signed)
       Jennifer [name] is a 50 y.o. female who presents to [facility]: [place] today for well adult exam. Patient is doing well with no active medical problems. She exercises regularly and tries to watch her diet. She notes her last menstrual period was about 2 years ago. She notes hot flashes but they're not bothersome. She just recently got a mammogram which was normal. She takes the medications listed below. Overall she is quite satisfied with how well she is doing   Past Medical History:  Diagnosis Date  . Fibroids    Past Surgical History:  Procedure Laterality Date  . HEEL SPUR SURGERY Right   . SHOULDER SURGERY Right    Social History  Substance Use Topics  . Smoking status: Never Smoker  . Smokeless tobacco: Never Used  . Alcohol use No   family history includes Cancer in her paternal grandmother; Diabetes in her father; Hyperlipidemia in her mother.  ROS as above:  Medications: Current Outpatient Prescriptions  Medication Sig Dispense Refill  . cetirizine (ZYRTEC) 5 MG tablet Take by mouth.    . clobetasol cream (TEMOVATE) 0.05 % Apply to affected area BID for 1 week then daily for one week 30 g 5  . diclofenac sodium (VOLTAREN) 1 % GEL Apply 4 g topically 4 (four) times daily. To affected joint. 100 g 11  . fluticasone (FLONASE) 50 MCG/ACT nasal spray Place into both nostrils daily.     No current facility-administered medications for this visit.    Allergies  Allergen Reactions  . Hydrocodone-Acetaminophen   . Influenza Vaccines     Health Maintenance Health Maintenance  Topic Date Due  . INFLUENZA VACCINE  12/05/2020 (Originally 06/19/2016)  . PAP SMEAR  03/06/2018  . TETANUS/TDAP  03/08/2022  . HIV Screening  Completed     Exam:  BP 114/66   Pulse (!) 54   Wt 167 lb (75.8 kg)   LMP 08/02/2014   BMI 26.55 kg/m  Gen: Well NAD HEENT: EOMI,   MMM Lungs: Normal work of breathing. CTABL Heart: RRR no MRG Abd: NABS, Soft. Nondistended, Nontender Exts: Brisk capillary refill, warm and well perfused.    No results found for this or any previous visit (from the past 72 hour(s)). No results found.    Assessment and Plan: 50 y.o. female with Well adult. No active issues addressed were identified. Patient is up-to-date with health maintenance. Recommend bone density test at the next wellness visit. We'll repeat lipids and metabolic panel. Return in one year or sooner if needed.   No orders of the defined types were placed in this encounter.   Discussed warning signs or symptoms. Please see discharge instructions. Patient expresses understanding.

## 2017-04-10 DIAGNOSIS — L814 Other melanin hyperpigmentation: Secondary | ICD-10-CM | POA: Diagnosis not present

## 2017-04-10 DIAGNOSIS — D2271 Melanocytic nevi of right lower limb, including hip: Secondary | ICD-10-CM | POA: Diagnosis not present

## 2017-04-10 DIAGNOSIS — B078 Other viral warts: Secondary | ICD-10-CM | POA: Diagnosis not present

## 2017-04-10 DIAGNOSIS — D2362 Other benign neoplasm of skin of left upper limb, including shoulder: Secondary | ICD-10-CM | POA: Diagnosis not present

## 2017-04-10 DIAGNOSIS — L910 Hypertrophic scar: Secondary | ICD-10-CM | POA: Diagnosis not present

## 2017-04-10 DIAGNOSIS — L304 Erythema intertrigo: Secondary | ICD-10-CM | POA: Diagnosis not present

## 2017-04-10 DIAGNOSIS — D2261 Melanocytic nevi of right upper limb, including shoulder: Secondary | ICD-10-CM | POA: Diagnosis not present

## 2017-04-10 DIAGNOSIS — D485 Neoplasm of uncertain behavior of skin: Secondary | ICD-10-CM | POA: Diagnosis not present

## 2017-04-10 DIAGNOSIS — D225 Melanocytic nevi of trunk: Secondary | ICD-10-CM | POA: Diagnosis not present

## 2017-05-20 ENCOUNTER — Encounter: Payer: Self-pay | Admitting: Family Medicine

## 2017-08-06 ENCOUNTER — Ambulatory Visit: Payer: BLUE CROSS/BLUE SHIELD | Admitting: Family Medicine

## 2017-08-06 ENCOUNTER — Ambulatory Visit (INDEPENDENT_AMBULATORY_CARE_PROVIDER_SITE_OTHER): Payer: BLUE CROSS/BLUE SHIELD | Admitting: Family Medicine

## 2017-08-06 DIAGNOSIS — Z1239 Encounter for other screening for malignant neoplasm of breast: Secondary | ICD-10-CM

## 2017-08-06 DIAGNOSIS — L259 Unspecified contact dermatitis, unspecified cause: Secondary | ICD-10-CM

## 2017-08-06 DIAGNOSIS — L219 Seborrheic dermatitis, unspecified: Secondary | ICD-10-CM | POA: Diagnosis not present

## 2017-08-06 DIAGNOSIS — L290 Pruritus ani: Secondary | ICD-10-CM | POA: Diagnosis not present

## 2017-08-06 DIAGNOSIS — Z1231 Encounter for screening mammogram for malignant neoplasm of breast: Secondary | ICD-10-CM

## 2017-08-06 MED ORDER — KETOCONAZOLE 2 % EX CREA: 1.0000 "application " | TOPICAL_CREAM | Freq: Two times a day (BID) | CUTANEOUS | 3 refills | Status: DC

## 2017-08-06 MED ORDER — CLOBETASOL PROPIONATE 0.05 % EX CREA: TOPICAL_CREAM | CUTANEOUS | 5 refills | Status: DC

## 2017-08-06 NOTE — Patient Instructions (Addendum)
Thank you for coming in today. Apply the ketoconazole cream to the rash on the face twice daily.  You can use it with hydrocortisone cream as needed as well.  Stop the creams when the rash goes away.   Get mammogram at some point in the near future.  Recheck for well exam in November as scheduled.  Seborrheic Dermatitis, Adult Seborrheic dermatitis is a skin disease that causes red, scaly patches. It usually occurs on the scalp, and it is often called dandruff. The patches may appear on other parts of the body. Skin patches tend to appear where there are many oil glands in the skin. Areas of the body that are commonly affected include:  Scalp.  Skin folds of the body.  Ears.  Eyebrows.  Neck.  Face.  Armpits.  The bearded area of men's faces.  The condition may come and go for no known reason, and it is often long-lasting (chronic). What are the causes? The cause of this condition is not known. What increases the risk? This condition is more likely to develop in people who:  Have certain conditions, such as: ? HIV (human immunodeficiency virus). ? AIDS (acquired immunodeficiency syndrome). ? Parkinson disease. ? Mood disorders, such as depression.  Are 32-48 years old.  What are the signs or symptoms? Symptoms of this condition include:  Thick scales on the scalp.  Redness on the face or in the armpits.  Skin that is flaky. The flakes may be white or yellow.  Skin that seems oily or dry but is not helped with moisturizers.  Itching or burning in the affected areas.  How is this diagnosed? This condition is diagnosed with a medical history and physical exam. A sample of your skin may be tested (skin biopsy). You may need to see a skin specialist (dermatologist). How is this treated? There is no cure for this condition, but treatment can help to manage the symptoms. You may get treatment to remove scales, lower the risk of skin infection, and reduce swelling or  itching. Treatment may include:  Creams that reduce swelling and irritation (steroids).  Creams that reduce skin yeast.  Medicated shampoo, soaps, moisturizing creams, or ointments.  Medicated moisturizing creams or ointments.  Follow these instructions at home:  Apply over-the-counter and prescription medicines only as told by your health care provider.  Use any medicated shampoo, soaps, skin creams, or ointments only as told by your health care provider.  Keep all follow-up visits as told by your health care provider. This is important. Contact a health care provider if:  Your symptoms do not improve with treatment.  Your symptoms get worse.  You have new symptoms. This information is not intended to replace advice given to you by your health care provider. Make sure you discuss any questions you have with your health care provider. Document Released: 11/05/2005 Document Revised: 05/25/2016 Document Reviewed: 02/23/2016 Elsevier Interactive Patient Education  2018 [place] Dermatitis Dermatitis is redness, soreness, and swelling (inflammation) of the skin. Contact dermatitis is a reaction to certain substances that touch the skin. There are two types of contact dermatitis:  Irritant contact dermatitis. This type is caused by something that irritates your skin, such as dry hands from washing them too much. This type does not require previous exposure to the substance for a reaction to occur. This type is more common.  Allergic contact dermatitis. This type is caused by a substance that you are allergic to, such as a nickel allergy  or poison ivy. This type only occurs if you have been exposed to the substance (allergen) before. Upon a repeat exposure, your body reacts to the substance. This type is less common.  What are the causes? Many different substances can cause contact dermatitis. Irritant contact dermatitis is most commonly caused by exposure  to:  Makeup.  Soaps.  Detergents.  Bleaches.  Acids.  Metal salts, such as nickel.  Allergic contact dermatitis is most commonly caused by exposure to:  Poisonous plants.  Chemicals.  Jewelry.  Latex.  Medicines.  Preservatives in products, such as clothing.  What increases the risk? This condition is more likely to develop in:  People who have jobs that expose them to irritants or allergens.  People who have certain medical conditions, such as asthma or eczema.  What are the signs or symptoms? Symptoms of this condition may occur anywhere on your body where the irritant has touched you or is touched by you. Symptoms include:  Dryness or flaking.  Redness.  Cracks.  Itching.  Pain or a burning feeling.  Blisters.  Drainage of small amounts of blood or clear fluid from skin cracks.  With allergic contact dermatitis, there may also be swelling in areas such as the eyelids, mouth, or genitals. How is this diagnosed? This condition is diagnosed with a medical history and physical exam. A patch skin test may be performed to help determine the cause. If the condition is related to your job, you may need to see an occupational medicine specialist. How is this treated? Treatment for this condition includes figuring out what caused the reaction and protecting your skin from further contact. Treatment may also include:  Steroid creams or ointments. Oral steroid medicines may be needed in more severe cases.  Antibiotics or antibacterial ointments, if a skin infection is present.  Antihistamine lotion or an antihistamine taken by mouth to ease itching.  A bandage (dressing).  Follow these instructions at home: Keeler your skin as needed.  Apply cool compresses to the affected areas.  Try taking a bath with: ? Epsom salts. Follow the instructions on the packaging. You can get these at your local pharmacy or grocery store. ? Baking soda.  Pour a small amount into the bath as directed by your health care provider. ? Colloidal oatmeal. Follow the instructions on the packaging. You can get this at your local pharmacy or grocery store.  Try applying baking soda paste to your skin. Stir water into baking soda until it reaches a paste-like consistency.  Do not scratch your skin.  Bathe less frequently, such as every other day.  Bathe in lukewarm water. Avoid using hot water. Medicines  Take or apply over-the-counter and prescription medicines only as told by your health care provider.  If you were prescribed an antibiotic medicine, take or apply your antibiotic as told by your health care provider. Do not stop using the antibiotic even if your condition starts to improve. General instructions  Keep all follow-up visits as told by your health care provider. This is important.  Avoid the substance that caused your reaction. If you do not know what caused it, keep a journal to try to track what caused it. Write down: ? What you eat. ? What cosmetic products you use. ? What you drink. ? What you wear in the affected area. This includes jewelry.  If you were given a dressing, take care of it as told by your health care provider. This includes when  to change and remove it. Contact a health care provider if:  Your condition does not improve with treatment.  Your condition gets worse.  You have signs of infection such as swelling, tenderness, redness, soreness, or warmth in the affected area.  You have a fever.  You have new symptoms. Get help right away if:  You have a severe headache, neck pain, or neck stiffness.  You vomit.  You feel very sleepy.  You notice red streaks coming from the affected area.  Your bone or joint underneath the affected area becomes painful after the skin has healed.  The affected area turns darker.  You have difficulty breathing. This information is not intended to replace advice  given to you by your health care provider. Make sure you discuss any questions you have with your health care provider. Document Released: 11/02/2000 Document Revised: 04/12/2016 Document Reviewed: 03/23/2015 Elsevier Interactive Patient Education  2018 [employer].

## 2017-08-07 NOTE — Progress Notes (Signed)
Jennifer [name] is a 51 y.o. female who presents to [place]: [place] today for rash. Patient has a mild rash on her face and right forearm. The facial rash is been present for about a month off and on. She's not tried much treatment. She notes some scaliness around the nasolabial folds. The rash on arms been present for a few days and is not particularly itchy. She cannot think of any new exposures. Shampoos cosmetics etc.  Additionally patient notes that she needs a refill of the clobetasol cream that she uses for perianal pruritus. She notes this medication controls her symptoms quite well.   Past Medical History:  Diagnosis Date  . Fibroids    Past Surgical History:  Procedure Laterality Date  . HEEL SPUR SURGERY Right   . SHOULDER SURGERY Right    Social History  Substance Use Topics  . Smoking status: Never Smoker  . Smokeless tobacco: Never Used  . Alcohol use No   family history includes Cancer in her paternal grandmother; Diabetes in her father; Hyperlipidemia in her mother.  ROS as above:  Medications: Current Outpatient Prescriptions  Medication Sig Dispense Refill  . cetirizine (ZYRTEC) 5 MG tablet Take by mouth.    . clobetasol cream (TEMOVATE) 0.05 % Apply to affected area BID for 1 week then daily for one week 30 g 5  . diclofenac sodium (VOLTAREN) 1 % GEL Apply 4 g topically 4 (four) times daily. To affected joint. 100 g 11  . fluticasone (FLONASE) 50 MCG/ACT nasal spray Place into both nostrils daily.    Marland Kitchen ketoconazole (NIZORAL) 2 % cream Apply 1 application topically 2 (two) times daily. To affected areas. 60 g 3   No current facility-administered medications for this visit.    Allergies  Allergen Reactions  . Hydrocodone-Acetaminophen   . Influenza Vaccines   . Chloraprep One Step [Chlorhexidine Gluconate] Rash    Health Maintenance Health  Maintenance  Topic Date Due  . PAP SMEAR  03/06/2018  . TETANUS/TDAP  03/08/2022  . HIV Screening  Completed     Exam:  LMP 08/02/2014  Gen: Well NAD HEENT: EOMI,  MMM Lungs: Normal work of breathing. CTABL Heart: RRR no MRG Abd: NABS, Soft. Nondistended, Nontender Exts: Brisk capillary refill, warm and well perfused.  Skin: Scaly mildly erythematous macular rash around the nasolabial fold right-sided face. Additionally there is a mildly erythematous circular macular patch and right forearm. Nontender.   No results found for this or any previous visit (from the past 72 hour(s)). No results found.    Assessment and Plan: 51 y.o. female with  Seborrheic dermatitis on the face. Plan to treat with ketoconazole cream as well as over-the-counter hydrocortisone cream.  The rash on right forearm is likely contact dermatitis. Plan to use over-the-counter hydrocortisone cream as needed.  Perianal pruritus: Refill clobetasol  Mammogram ordered for breast cancer screening.   Orders Placed This Encounter  Procedures  . MM SCREENING BREAST TOMO BILATERAL    Standing Status:   Future    Standing Expiration Date:   10/06/2018    Order Specific Question:   Reason for Exam (SYMPTOM  OR DIAGNOSIS REQUIRED)    Answer:   screen breast cancer    Order Specific Question:   Is the patient pregnant?    Answer:   No    Order Specific Question:   Preferred imaging location?    Answer:   [facility]  Meds ordered this encounter  Medications  . clobetasol cream (TEMOVATE) 0.05 %    Sig: Apply to affected area BID for 1 week then daily for one week    Dispense:  30 g    Refill:  5  . ketoconazole (NIZORAL) 2 % cream    Sig: Apply 1 application topically 2 (two) times daily. To affected areas.    Dispense:  60 g    Refill:  3     Discussed warning signs or symptoms. Please see discharge instructions. Patient expresses understanding.  I spent 25 minutes with this patient,  greater than 50% was face-to-face time counseling regarding differential diagnosis and treatment plan. Marland Kitchen

## 2017-09-27 ENCOUNTER — Ambulatory Visit (INDEPENDENT_AMBULATORY_CARE_PROVIDER_SITE_OTHER): Payer: BLUE CROSS/BLUE SHIELD

## 2017-09-27 DIAGNOSIS — Z1231 Encounter for screening mammogram for malignant neoplasm of breast: Secondary | ICD-10-CM | POA: Diagnosis not present

## 2017-09-27 DIAGNOSIS — Z1239 Encounter for other screening for malignant neoplasm of breast: Secondary | ICD-10-CM

## 2017-09-30 ENCOUNTER — Ambulatory Visit: Payer: BLUE CROSS/BLUE SHIELD | Admitting: Family Medicine

## 2017-09-30 VITALS — BP 110/77 | Ht 66.5 in | Wt 175.0 lb

## 2017-09-30 DIAGNOSIS — Z780 Asymptomatic menopausal state: Secondary | ICD-10-CM

## 2017-09-30 DIAGNOSIS — Z0000 Encounter for general adult medical examination without abnormal findings: Secondary | ICD-10-CM | POA: Diagnosis not present

## 2017-09-30 DIAGNOSIS — E7800 Pure hypercholesterolemia, unspecified: Secondary | ICD-10-CM | POA: Diagnosis not present

## 2017-09-30 DIAGNOSIS — R7303 Prediabetes: Secondary | ICD-10-CM | POA: Diagnosis not present

## 2017-09-30 NOTE — Patient Instructions (Signed)
Thank you for coming in today. Get fasting labs in the near future.  Get the bone density test.  We will get records about Colon Cancer screening.  Pap smear is due in May of 2019.  Try to prioritize exercise in your day.

## 2017-09-30 NOTE — Progress Notes (Signed)
Jennifer [name] is a 51 y.o. female who presents to [place]: [place] today for well adult.   Jennifer [name] is doing well overall she denies any acute issues today. She notes that she has trouble finding time to exercise but is overall try to pay attention to her diet. She has obtained her breast cancer screening already and thinks that she had a colonoscopy about 5 years ago at [facility]. She notes that her Pap smear was done about 2-1/2 years ago. She feels well overall.   Past Medical History:  Diagnosis Date  . Fibroids    Past Surgical History:  Procedure Laterality Date  . HEEL SPUR SURGERY Right   . SHOULDER SURGERY Right    Social History   Tobacco Use  . Smoking status: Never Smoker  . Smokeless tobacco: Never Used  Substance Use Topics  . Alcohol use: No    Alcohol/week: 0.0 oz   family history includes Cancer in her paternal grandmother; Diabetes in her father; Hyperlipidemia in her mother.  ROS as above:  Medications: Current Outpatient Medications  Medication Sig Dispense Refill  . diclofenac sodium (VOLTAREN) 1 % GEL Apply 4 g topically 4 (four) times daily. To affected joint. 100 g 11  . fluticasone (FLONASE) 50 MCG/ACT nasal spray Place into both nostrils daily.    Marland Kitchen levocetirizine (XYZAL) 5 MG tablet Take 5 mg every evening by mouth.    . clobetasol cream (TEMOVATE) 0.05 % Apply to affected area BID for 1 week then daily for one week 30 g 5   No current facility-administered medications for this visit.    Allergies  Allergen Reactions  . Hydrocodone-Acetaminophen   . Influenza Vaccines   . Chloraprep One Step [Chlorhexidine Gluconate] Rash    Health Maintenance Health Maintenance  Topic Date Due  . COLONOSCOPY  09/30/2017  . PAP SMEAR  03/06/2018  . MAMMOGRAM  06/06/2018  . TETANUS/TDAP  03/08/2022  . HIV Screening  Completed      Exam:  BP 110/77   Ht 5' 6.5" (1.689 m)   Wt 175 lb (79.4 kg)   LMP 08/02/2014   BMI 27.82 kg/m  Gen: Well NAD HEENT: EOMI,  MMM Lungs: Normal work of breathing. CTABL Heart: RRR no MRG Abd: NABS, Soft. Nondistended, Nontender Exts: Brisk capillary refill, warm and well perfused.    No results found for this or any previous visit (from the past 72 hour(s)). Mm Screening Breast Tomo Bilateral  Result Date: 09/30/2017 CLINICAL DATA:  Screening. EXAM: 2D DIGITAL SCREENING BILATERAL MAMMOGRAM WITH CAD AND ADJUNCT TOMO COMPARISON:  Previous exam(s). ACR Breast Density Category b: There are scattered areas of fibroglandular density. FINDINGS: There are no findings suspicious for malignancy. Images were processed with CAD. IMPRESSION: No mammographic evidence of malignancy. A result letter of this screening mammogram will be mailed directly to the patient. RECOMMENDATION: Screening mammogram in one year. (Code:SM-B-01Y) BI-RADS CATEGORY  1: Negative. Electronically Signed   By: Fidela Salisbury M.D.   On: 09/30/2017 10:10      Assessment and Plan: 51 y.o. female with  Well adult: Doing well overall. We will re-update fasting labs to be done in the near future. Additionally we'll check a bone density as patient is postmenopausal. Continue current regimen and try to find more time for exercise and her day. Recheck yearly as needed.   Orders Placed This Encounter  Procedures  . DG Bone Density    Standing Status:  Future    Standing Expiration Date:   12/01/2018    Order Specific Question:   Reason for exam:    Answer:   eval bone density    Order Specific Question:   Preferred imaging location?    Answer:   [facility]  . CBC  . COMPLETE METABOLIC PANEL WITH GFR  . Lipid Panel w/reflex Direct LDL  . VITAMIN D 25 Hydroxy (Vit-D Deficiency, Fractures)  . Hemoglobin A1c   Meds ordered this encounter  Medications  . levocetirizine (XYZAL) 5 MG tablet    Sig:  Take 5 mg every evening by mouth.     Discussed warning signs or symptoms. Please see discharge instructions. Patient expresses understanding.

## 2017-10-09 ENCOUNTER — Ambulatory Visit (INDEPENDENT_AMBULATORY_CARE_PROVIDER_SITE_OTHER): Payer: BLUE CROSS/BLUE SHIELD

## 2017-10-09 DIAGNOSIS — Z780 Asymptomatic menopausal state: Secondary | ICD-10-CM | POA: Diagnosis not present

## 2017-10-09 DIAGNOSIS — M8588 Other specified disorders of bone density and structure, other site: Secondary | ICD-10-CM | POA: Diagnosis not present

## 2017-10-14 ENCOUNTER — Encounter: Payer: Self-pay | Admitting: Family Medicine

## 2017-10-14 DIAGNOSIS — M810 Age-related osteoporosis without current pathological fracture: Secondary | ICD-10-CM | POA: Insufficient documentation

## 2017-12-06 DIAGNOSIS — Z780 Asymptomatic menopausal state: Secondary | ICD-10-CM | POA: Diagnosis not present

## 2017-12-06 DIAGNOSIS — E7800 Pure hypercholesterolemia, unspecified: Secondary | ICD-10-CM | POA: Diagnosis not present

## 2017-12-06 DIAGNOSIS — R7303 Prediabetes: Secondary | ICD-10-CM | POA: Diagnosis not present

## 2017-12-07 LAB — LIPID PANEL W/REFLEX DIRECT LDL
CHOL/HDL RATIO: 4.2 (calc) (ref <5.0)
CHOLESTEROL: 236 mg/dL — AB (ref <200)
HDL: 56 mg/dL (ref >50)
LDL Cholesterol (Calc): 151 mg/dL (calc) — ABNORMAL HIGH
Non-HDL Cholesterol (Calc): 180 mg/dL (calc) — ABNORMAL HIGH (ref <130)
Triglycerides: 157 mg/dL — ABNORMAL HIGH (ref <150)

## 2017-12-07 LAB — HEMOGLOBIN A1C
HEMOGLOBIN A1C: 5.8 %{Hb} — AB (ref <5.7)
Mean Plasma Glucose: 120 (calc)
eAG (mmol/L): 6.6 (calc)

## 2017-12-07 LAB — VITAMIN D 25 HYDROXY (VIT D DEFICIENCY, FRACTURES): Vit D, 25-Hydroxy: 35 ng/mL (ref 30–100)

## 2017-12-07 LAB — CBC
HEMATOCRIT: 38.6 % (ref 35.0–45.0)
Hemoglobin: 13 g/dL (ref 11.7–15.5)
MCH: 30.9 pg (ref 27.0–33.0)
MCHC: 33.7 g/dL (ref 32.0–36.0)
MCV: 91.7 fL (ref 80.0–100.0)
MPV: 10.7 fL (ref 7.5–12.5)
Platelets: 255 10*3/uL (ref 140–400)
RBC: 4.21 10*6/uL (ref 3.80–5.10)
RDW: 13.2 % (ref 11.0–15.0)
WBC: 6.4 10*3/uL (ref 3.8–10.8)

## 2017-12-07 LAB — COMPLETE METABOLIC PANEL WITH GFR
AG Ratio: 1.8 (calc) (ref 1.0–2.5)
ALKALINE PHOSPHATASE (APISO): 92 U/L (ref 33–130)
ALT: 12 U/L (ref 6–29)
AST: 17 U/L (ref 10–35)
Albumin: 4.5 g/dL (ref 3.6–5.1)
BILIRUBIN TOTAL: 0.6 mg/dL (ref 0.2–1.2)
BUN: 16 mg/dL (ref 7–25)
CHLORIDE: 104 mmol/L (ref 98–110)
CO2: 29 mmol/L (ref 20–32)
CREATININE: 0.85 mg/dL (ref 0.50–1.05)
Calcium: 9.3 mg/dL (ref 8.6–10.4)
GFR, Est African American: 93 mL/min/{1.73_m2} (ref >=60)
GFR, Est Non African American: 80 mL/min/{1.73_m2} (ref >=60)
GLOBULIN: 2.5 g/dL (ref 1.9–3.7)
GLUCOSE: 89 mg/dL (ref 65–99)
POTASSIUM: 4.3 mmol/L (ref 3.5–5.3)
SODIUM: 140 mmol/L (ref 135–146)
Total Protein: 7 g/dL (ref 6.1–8.1)

## 2018-01-13 ENCOUNTER — Ambulatory Visit: Payer: BLUE CROSS/BLUE SHIELD | Admitting: Family Medicine

## 2018-01-13 ENCOUNTER — Encounter: Payer: Self-pay | Admitting: Family Medicine

## 2018-01-13 VITALS — BP 114/82 | HR 71 | Temp 98.3°F | Wt 172.0 lb

## 2018-01-13 DIAGNOSIS — R6889 Other general symptoms and signs: Secondary | ICD-10-CM | POA: Diagnosis not present

## 2018-01-13 DIAGNOSIS — R0989 Other specified symptoms and signs involving the circulatory and respiratory systems: Secondary | ICD-10-CM | POA: Insufficient documentation

## 2018-01-13 MED ORDER — IPRATROPIUM BROMIDE 0.06 % NA SOLN: 2.0000 | NASAL | 6 refills | Status: DC | PRN

## 2018-01-13 NOTE — Patient Instructions (Signed)
Thank you for coming in today. I think this is residual vocal cord irritation for illness and will likely resolve.  But 80 days is getting a little long.  Lets try the atrovent nasal spray in addition to flonase.  Consider adding over the counter omeprazole acid medicine.   If not better in a few weeks let me know and I will refer to ENT ([facility]).

## 2018-01-13 NOTE — Progress Notes (Signed)
Jennifer [name] is a 52 y.o. female who presents to [place]: [place] today for cough and throat clearing. [facility] notes a 2-1/59-month history of persistent cough and throat clearing.  There is typically worse when talking.  She denies significant symptoms around that time.  She denies any acid reflux or regurgitation or sour taste in her mouth.  She notes the symptoms started after she had an illness that caused coughing fevers and chills.  All the symptoms resolved except for throat clearing and occasional nonproductive cough.  She denies any wheezing or shortness of breath.  She notes she has a history of postnasal drainage and has been using Flonase nasal spray and levocetirizine.  These treatments have not helped these particular symptoms.  She denies any chest pain or palpitations or significant shortness of breath.   Past Medical History:  Diagnosis Date  . Fibroids    Past Surgical History:  Procedure Laterality Date  . HEEL SPUR SURGERY Right   . SHOULDER SURGERY Right    Social History   Tobacco Use  . Smoking status: Never Smoker  . Smokeless tobacco: Never Used  Substance Use Topics  . Alcohol use: No    Alcohol/week: 0.0 oz   family history includes Cancer in her paternal grandmother; Diabetes in her father; Hyperlipidemia in her mother.  ROS as above:  Medications: Current Outpatient Medications  Medication Sig Dispense Refill  . clobetasol cream (TEMOVATE) 0.05 % Apply to affected area BID for 1 week then daily for one week 30 g 5  . diclofenac sodium (VOLTAREN) 1 % GEL Apply 4 g topically 4 (four) times daily. To affected joint. 100 g 11  . fluticasone (FLONASE) 50 MCG/ACT nasal spray Place into both nostrils daily.    Marland Kitchen levocetirizine (XYZAL) 5 MG tablet Take 5 mg every evening by mouth.    Marland Kitchen ipratropium (ATROVENT) 0.06 % nasal spray Place 2 sprays into  both nostrils every 4 (four) hours as needed for rhinitis. 10 mL 6   No current facility-administered medications for this visit.    Allergies  Allergen Reactions  . Hydrocodone-Acetaminophen   . Influenza Vaccines   . Chloraprep One Step [Chlorhexidine Gluconate] Rash    Health Maintenance Health Maintenance  Topic Date Due  . PAP SMEAR  03/06/2018  . MAMMOGRAM  09/28/2019  . COLONOSCOPY  11/07/2020  . TETANUS/TDAP  03/08/2022  . HIV Screening  Completed     Exam:  BP 114/82   Pulse 71   Temp 98.3 F (36.8 C) (Oral)   Wt 172 lb (78 kg)   LMP 08/02/2014   BMI 27.35 kg/m   Wt Readings from Last 5 Encounters:  01/13/18 172 lb (78 kg)  09/30/17 175 lb (79.4 kg)  10/01/16 167 lb (75.8 kg)  07/30/16 169 lb (76.7 kg)  06/25/16 172 lb (78 kg)    Gen: Well NAD  HEENT: EOMI,  MMM posterior pharynx with mild cobblestoning.  No significant erythema.  Minimal nasal turbinate edema with no significant discharge.  No cervical lymphadenopathy or masses palpated Lungs: Normal work of breathing. CTABL Heart: RRR no MRG Exts: Brisk capillary refill, warm and well perfused.      Assessment and Plan: 52 y.o. female with throat clearing and cough.  Etiology is somewhat unclear.  The top 3 differential are postnasal drainage acid reflux or cough variant asthma. Jennifer [name] had a history of what sounds like a viral illness preceding her  symptoms.  She denies any acid reflux symptoms and has never had acid reflux therefore I think this is less likely.  Additionally she denies any history of asthma or any asthma symptoms..   Plan for trial of Atrovent nasal spray to augment the Flonase and levocetirizine.  I offered a trial of omeprazole and albuterol for acid reflux and asthma patient declined.  I agree I do not not think this is going to be helpful.  If not better the plan at this point would be referral to ENT for possible laryngoscopy.  No orders of the defined types were placed in this  encounter.  Meds ordered this encounter  Medications  . ipratropium (ATROVENT) 0.06 % nasal spray    Sig: Place 2 sprays into both nostrils every 4 (four) hours as needed for rhinitis.    Dispense:  10 mL    Refill:  6     Discussed warning signs or symptoms. Please see discharge instructions. Patient expresses understanding.

## 2018-02-23 ENCOUNTER — Emergency Department
Admission: EM | Admit: 2018-02-23 | Discharge: 2018-02-23 | Disposition: A | Discharge status: Home / Self Care | Payer: BLUE CROSS/BLUE SHIELD | Source: Home / Self Care | Attending: Family Medicine | Admitting: Family Medicine

## 2018-02-23 ENCOUNTER — Encounter: Payer: Self-pay | Admitting: Emergency Medicine

## 2018-02-23 DIAGNOSIS — J069 Acute upper respiratory infection, unspecified: Secondary | ICD-10-CM

## 2018-02-23 DIAGNOSIS — J3089 Other allergic rhinitis: Secondary | ICD-10-CM

## 2018-02-23 DIAGNOSIS — B9789 Other viral agents as the cause of diseases classified elsewhere: Secondary | ICD-10-CM | POA: Diagnosis not present

## 2018-02-23 MED ORDER — AZITHROMYCIN 250 MG PO TABS: ORAL_TABLET | ORAL | 0 refills | Status: DC

## 2018-02-23 NOTE — Discharge Instructions (Addendum)
Take plain guaifenesin (1200mg  extended release tabs such as Mucinex) twice daily, with plenty of water, for cough and congestion.  Get adequate rest.   May use Afrin nasal spray (or generic oxymetazoline) each morning for about 5 days and then discontinue.  Also recommend using saline nasal spray several times daily and saline nasal irrigation (AYR is a common brand).  Use Flonase and ipratropium nasal sprays each morning after using Afrin nasal spray and saline nasal irrigation. Try warm salt water gargles for sore throat.  Stop all antihistamines for now, and other non-prescription cough/cold preparations. May take Delsym Cough Suppressant at bedtime for nighttime cough.

## 2018-02-23 NOTE — ED Triage Notes (Signed)
Patient complaining of head congestion, left neck pain and swollen, possible allergies, no cough.

## 2018-02-23 NOTE — ED Provider Notes (Signed)
[name]    CSN: [id number] Arrival date & time: 02/23/18  1149     History   Chief Complaint Chief Complaint  Patient presents with  . URI    HPI Jennifer [name] is a 52 y.o. female.   Patient developed sneezing and sinus congestion one week ago, followed by sore throat and neck soreness.  During the past 3 days she has had increased cough and low grade fever.  No pleuritic pain or shortness of breath. She has a history of perennial rhinitis.  The history is provided by the patient.    Past Medical History:  Diagnosis Date  . Fibroids     Patient Active Problem List   Diagnosis Date Noted  . Throat clearing 01/13/2018  . Osteoporosis 10/14/2017  . Post-menopausal 09/30/2017  . Prepatellar bursitis of left knee 06/25/2016  . Prediabetes 06/15/2016  . Elevated LDL cholesterol level 05/30/2016  . Perianal pruritus 06/07/2015  . Tricuspid regurgitation 06/07/2015  . Frequent urination 03/21/2015  . ALLERGIC RHINITIS 03/18/2011    Past Surgical History:  Procedure Laterality Date  . HEEL SPUR SURGERY Right   . SHOULDER SURGERY Right     OB History    Gravida  1   Para  1   Term  1   Preterm      AB      Living  1     SAB      TAB      Ectopic      Multiple      Live Births           Obstetric Comments  Has an adopted daughter         Home Medications    Prior to Admission medications   Medication Sig Start Date End Date Taking? Authorizing Provider  azithromycin (ZITHROMAX Z-PAK) 250 MG tablet Take 2 tabs today; then begin one tab once daily for 4 more days. 02/23/18   Kandra Nicolas, MD  clobetasol cream (TEMOVATE) 0.05 % Apply to affected area BID for 1 week then daily for one week 08/06/17   Gregor Hams, MD  diclofenac sodium (VOLTAREN) 1 % GEL Apply 4 g topically 4 (four) times daily. To affected joint. 05/30/16   Gregor Hams, MD  fluticasone (FLONASE) 50 MCG/ACT nasal spray Place into both nostrils daily.     [provider]  ipratropium (ATROVENT) 0.06 % nasal spray Place 2 sprays into both nostrils every 4 (four) hours as needed for rhinitis. 01/13/18   Gregor Hams, MD  levocetirizine (XYZAL) 5 MG tablet Take 5 mg every evening by mouth.    [provider]    Family History Family History  Problem Relation Age of Onset  . Hyperlipidemia Mother   . Diabetes Father   . Cancer Paternal Grandmother        vulvar cancer    Social History Social History   Tobacco Use  . Smoking status: Never Smoker  . Smokeless tobacco: Never Used  Substance Use Topics  . Alcohol use: No    Alcohol/week: 0.0 oz  . Drug use: No     Allergies   Hydrocodone-acetaminophen; Influenza vaccines; and Chloraprep one step [chlorhexidine gluconate]   Review of Systems Review of Systems + sore throat + cough No pleuritic pain No wheezing + nasal congestion + post-nasal drainage No sinus pain/pressure No itchy/red eyes No earache No hemoptysis No SOB + fever, + chills No nausea No vomiting No  abdominal pain No diarrhea No urinary symptoms No skin rash + fatigue No myalgias + headache Used OTC meds without relief   Physical Exam Triage Vital Signs ED Triage Vitals  Enc Vitals Group     BP 02/23/18 1205 115/77     Pulse Rate 02/23/18 1205 73     Resp --      Temp 02/23/18 1205 99.4 F (37.4 C)     Temp Source 02/23/18 1205 Oral     SpO2 02/23/18 1205 99 %     Weight 02/23/18 1206 170 lb (77.1 kg)     Height 02/23/18 1206 5\' 7"  (1.702 m)     Head Circumference --      Peak Flow --      Pain Score 02/23/18 1206 0     Pain Loc --      Pain Edu? --      Excl. in [place]? --    No data found.  Updated Vital Signs BP 115/77 (BP Location: Right Arm)   Pulse 73   Temp 99.4 F (37.4 C) (Oral)   Ht 5\' 7"  (1.702 m)   Wt 170 lb (77.1 kg)   LMP 08/02/2014   SpO2 99%   BMI 26.63 kg/m   Visual Acuity Right Eye Distance:   Left Eye Distance:   Bilateral Distance:     Right Eye Near:   Left Eye Near:    Bilateral Near:     Physical Exam Nursing notes and Vital Signs reviewed. Appearance:  Patient appears stated age, and in no acute distress Eyes:  Pupils are equal, round, and reactive to light and accomodation.  Extraocular movement is intact.  Conjunctivae are not inflamed  Ears:  Canals normal.  Tympanic membranes normal.  Nose:  Mildly congested turbinates.  No sinus tenderness.   Pharynx:  Normal Neck:  Supple.  Enlarged posterior/lateral nodes are palpated bilaterally, tender to palpation on the left.   Lungs:  Clear to auscultation.  Breath sounds are equal.  Moving air well. Heart:  Regular rate and rhythm without murmurs, rubs, or gallops.  Abdomen:  Nontender without masses or hepatosplenomegaly.  Bowel sounds are present.  No CVA or flank tenderness.  Extremities:  No edema.  Skin:  No rash present.    UC Treatments / Results  Labs (all labs ordered are listed, but only abnormal results are displayed) Labs Reviewed - No data to display  EKG None Radiology No results found.  Procedures Procedures (including critical care time)  Medications Ordered in UC Medications - No data to display   Initial Impression / Assessment and Plan / UC Course  I have reviewed the triage vital signs and the nursing notes.  Pertinent labs & imaging results that were available during my care of the patient were reviewed by me and considered in my medical decision making (see chart for details).    Because of patient's low grade fever after 6 days of URI symptoms will begin Z-pak. Take plain guaifenesin (1200mg  extended release tabs such as Mucinex) twice daily, with plenty of water, for cough and congestion.  Get adequate rest.   May use Afrin nasal spray (or generic oxymetazoline) each morning for about 5 days and then discontinue.  Also recommend using saline nasal spray several times daily and saline nasal irrigation (AYR is a common brand).   Use Flonase and ipratropium nasal sprays each morning after using Afrin nasal spray and saline nasal irrigation. Try warm salt water gargles for sore  throat.  Stop all antihistamines for now, and other non-prescription cough/cold preparations. May take Delsym Cough Suppressant at bedtime for nighttime cough.  Followup with Family Doctor if not improved in 7 to 10 days.    Final Clinical Impressions(s) / UC Diagnoses   Final diagnoses:  Viral URI with cough  Perennial allergic rhinitis    ED Discharge Orders        Ordered    azithromycin (ZITHROMAX Z-PAK) 250 MG tablet     02/23/18 1238          Kandra Nicolas, MD 02/27/18 2127

## 2018-03-10 ENCOUNTER — Encounter: Payer: Self-pay | Admitting: Family Medicine

## 2018-03-10 DIAGNOSIS — R0989 Other specified symptoms and signs involving the circulatory and respiratory systems: Secondary | ICD-10-CM

## 2018-03-10 DIAGNOSIS — R6889 Other general symptoms and signs: Secondary | ICD-10-CM

## 2018-03-18 DIAGNOSIS — R490 Dysphonia: Secondary | ICD-10-CM | POA: Diagnosis not present

## 2018-04-03 ENCOUNTER — Encounter: Payer: Self-pay | Admitting: Family Medicine

## 2018-04-04 ENCOUNTER — Encounter: Payer: Self-pay | Admitting: Family Medicine

## 2018-04-15 DIAGNOSIS — Z86018 Personal history of other benign neoplasm: Secondary | ICD-10-CM | POA: Diagnosis not present

## 2018-04-15 DIAGNOSIS — Z808 Family history of malignant neoplasm of other organs or systems: Secondary | ICD-10-CM | POA: Diagnosis not present

## 2018-04-15 DIAGNOSIS — L910 Hypertrophic scar: Secondary | ICD-10-CM | POA: Diagnosis not present

## 2018-04-15 DIAGNOSIS — D225 Melanocytic nevi of trunk: Secondary | ICD-10-CM | POA: Diagnosis not present

## 2018-04-29 LAB — BASIC METABOLIC PANEL: Glucose: 114

## 2018-04-29 LAB — LIPID PANEL
CHOLESTEROL: 252 — AB (ref 0–200)
HDL: 70 (ref 35–70)

## 2018-05-13 DIAGNOSIS — R490 Dysphonia: Secondary | ICD-10-CM | POA: Diagnosis not present

## 2018-05-13 DIAGNOSIS — H9041 Sensorineural hearing loss, unilateral, right ear, with unrestricted hearing on the contralateral side: Secondary | ICD-10-CM | POA: Diagnosis not present

## 2018-05-28 DIAGNOSIS — H9041 Sensorineural hearing loss, unilateral, right ear, with unrestricted hearing on the contralateral side: Secondary | ICD-10-CM | POA: Diagnosis not present

## 2018-07-30 ENCOUNTER — Encounter: Payer: Self-pay | Admitting: Family Medicine

## 2018-08-29 ENCOUNTER — Ambulatory Visit: Payer: BLUE CROSS/BLUE SHIELD | Admitting: Family Medicine

## 2018-08-29 ENCOUNTER — Encounter: Payer: Self-pay | Admitting: Family Medicine

## 2018-08-29 VITALS — BP 119/77 | HR 98 | Ht 67.0 in | Wt 169.0 lb

## 2018-08-29 DIAGNOSIS — K119 Disease of salivary gland, unspecified: Secondary | ICD-10-CM

## 2018-08-29 DIAGNOSIS — F410 Panic disorder [episodic paroxysmal anxiety] without agoraphobia: Secondary | ICD-10-CM | POA: Diagnosis not present

## 2018-08-29 MED ORDER — CEFDINIR 300 MG PO CAPS: 300.0000 mg | ORAL_CAPSULE | Freq: Two times a day (BID) | ORAL | 0 refills | Status: DC

## 2018-08-29 NOTE — Patient Instructions (Addendum)
Thank you for coming in today. Get labs now.  If not mumps (I dont think it is) fill and take the antibiotic.  If still not better next step is ENT.  Recheck with me as needed.   Panic Disorder.  I recommend therapy.  We can do some self guided CBT.  Let me know if not doing well.  Do some reading on this.   Salivary Stone A salivary stone is a mineral deposit that builds up in the ducts that drain your salivary glands. Most salivary gland stones are made of calcium. When a stone forms, saliva can back up into the gland and cause painful swelling. Your salivary glands are the glands that produce spit (saliva). You have six major salivary glands. Each gland has a duct that carries saliva into your mouth. Saliva keeps your mouth moist and breaks down the food that you eat. It also helps to prevent tooth decay. Two salivary glands are located just in front of your ears (parotid). The ducts for these glands open up inside your cheeks, near your back teeth. You also have two glands under your tongue (sublingual) and two glands under your jaw (submandibular). The ducts for these glands open under your tongue. A stone can form in any salivary gland. The most common place for a salivary stone to develop is in a submandibular salivary gland. What are the causes? Any condition that reduces the flow of saliva may lead to stone formation. It is not known why some people form stones and others do not. What increases the risk? You may be more likely to develop a salivary stone if you:  Are female.  Do not drink enough water.  Smoke.  Have high blood pressure.  Have gout.  Have diabetes.  What are the signs or symptoms? The main sign of a salivary gland stone is sudden swelling of a salivary gland when eating. This usually happens under the jaw on one side. Other signs and symptoms include:  Swelling of the cheek or under the tongue when eating.  Pain in the swollen area.  Trouble chewing or  swallowing.  Swelling that goes down after eating.  How is this diagnosed? Your health care provider may diagnose a salivary gland stone based on your signs and symptoms. The health care provider will also do a physical exam. In many cases, a stone can be felt in a duct inside your mouth. You may need to see an ear, nose, and throat specialist (ENT or otolaryngologist) for diagnosis and treatment. You may also need to have diagnostic tests. These may include imaging studies to check for a stone, such as:  X-rays.  Ultrasound.  CT scan.  MRI.  How is this treated? Home care may be enough to treat a small stone that is not causing symptoms. Treatment of a stone that is large enough to cause symptoms may include:  Probing and widening the duct to allow the stone to pass.  Inserting a thin, flexible scope (endoscope) into the duct to locate and remove the stone.  Breaking up the stone with sound waves.  Removing the entire salivary gland.  Follow these instructions at home:  Drink enough fluid to keep your urine clear or pale yellow.  Follow these instructions every few hours: ? Suck on a lemon candy to stimulate the flow of saliva. ? Put a hot compress over the gland. ? Gently massage the gland.  Do not use any tobacco products, including cigarettes, chewing tobacco, or  electronic cigarettes. If you need help quitting, ask your health care provider. Contact a health care provider if:  You have pain and swelling in your face, jaw, or mouth after eating.  You have persistent swelling in any of these places: ? In front of your ear. ? Under your jaw. ? Inside your mouth. Get help right away if:  You have pain and swelling in your face, jaw, or mouth that are getting worse.  Your pain and swelling make it hard to swallow or breathe. This information is not intended to replace advice given to you by your health care provider. Make sure you discuss any questions you have with  your health care provider. Document Released: 12/13/2004 Document Revised: 04/12/2016 Document Reviewed: 04/07/2014 Elsevier Interactive Patient Education  2018 [employer].   Panic Attack A panic attack is a sudden episode of severe anxiety, fear, or discomfort that causes physical and emotional symptoms. The attack may be in response to something frightening, or it may occur for no known reason. Symptoms of a panic attack can be similar to symptoms of a heart attack or stroke. It is important to see your health care provider when you have a panic attack so that these conditions can be ruled out. A panic attack is a symptom of another condition. Most panic attacks go away with treatment of the underlying problem. If you have panic attacks often, you may have a condition called panic disorder. What are the causes? A panic attack may be caused by:  An extreme, life-threatening situation, such as a war or natural disaster.  An anxiety disorder, such as post-traumatic stress disorder.  Depression.  Certain medical conditions, including heart problems, neurological conditions, and infections.  Certain over-the-counter and prescription medicines.  Illegal drugs that increase heart rate and blood pressure, such as methamphetamine.  Alcohol.  Supplements that increase anxiety.  Panic disorder.  What increases the risk? You are more likely to develop this condition if:  You have an anxiety disorder.  You have another mental health condition.  You take certain medicines.  You use alcohol, illegal drugs, or other substances.  You are under extreme stress.  A life event is causing increased feelings of anxiety and depression.  What are the signs or symptoms? A panic attack starts suddenly, usually lasts about 20 minutes, and occurs with one or more of the following:  A pounding heart.  A feeling that your heart is beating irregularly or faster than normal  (palpitations).  Sweating.  Trembling or shaking.  Shortness of breath or feeling smothered.  Feeling choked.  Chest pain or discomfort.  Nausea or a strange feeling in your stomach.  Dizziness, feeling lightheaded, or feeling like you might faint.  Chills or hot flashes.  Numbness or tingling in your lips, hands, or feet.  Feeling confused, or feeling that you are not yourself.  Fear of losing control or being emotionally unstable.  Fear of dying.  How is this diagnosed? A panic attack is diagnosed with an assessment by your health care provider. During the assessment your health care provider will ask questions about:  Your history of anxiety, depression, and panic attacks.  Your medical history.  Whether you drink alcohol, use illegal drugs, take supplements, or take medicines. Be honest about your substance use.  Your health care provider may also:  Order blood tests or other kinds of tests to rule out serious medical conditions.  Refer you to a mental health professional for further evaluation.  How is this treated? Treatment depends on the cause of the panic attack:  If the cause is a medical problem, your health care provider will either treat that problem or refer you to a specialist.  If the cause is emotional, you may be given anti-anxiety medicines or referred to a counselor. These medicines may reduce how often attacks happen, reduce how severe the attacks are, and lower anxiety.  If the cause is a medicine, your health care provider may tell you to stop the medicine, change your dose, or take a different medicine.  If the cause is a drug, treatment may involve letting the drug wear off and taking medicine to help the drug leave your body or to counteract its effects. Attacks caused by drug abuse may continue even if you stop using the drug.  Follow these instructions at home:  Take over-the-counter and prescription medicines only as told by your  health care provider.  If you feel anxious, limit your caffeine intake.  Take good care of your physical and mental health by: ? Eating a balanced diet that includes plenty of fresh fruits and vegetables, whole grains, lean meats, and low-fat dairy. ? Getting plenty of rest. Try to get 7-8 hours of uninterrupted sleep each night. ? Exercising regularly. Try to get 30 minutes of physical activity at least 5 days a week. ? Not smoking. Talk to your health care provider if you need help quitting. ? Limiting alcohol intake to no more than 1 drink a day for nonpregnant women and 2 drinks a day for men. One drink equals 12 oz of beer, 5 oz of wine, or 1 oz of hard liquor.  Keep all follow-up visits as told by your health care provider. This is important. Panic attacks may have underlying physical or emotional problems that take time to accurately diagnose. Contact a health care provider if:  Your symptoms do not improve, or they get worse.  You are not able to take your medicine as prescribed because of side effects. Get help right away if:  You have serious thoughts about hurting yourself or others.  You have symptoms of a panic attack. Do not drive yourself to the hospital. Have someone else drive you or call an ambulance. If you ever feel like you may hurt yourself or others, or you have thoughts about taking your own life, get help right away. You can go to your nearest emergency department or call:  Your local emergency services (911 in the [place]  A suicide crisis helpline, such as the [facility] at (931)852-9389. This is open 24 hours a day.  Summary  A panic attack is a sign of a serious health or mental health condition. Get help right away. Do not drive yourself to the hospital. Have someone else drive you or call an ambulance.  Always see a health care provider to have the reasons for the panic attack correctly diagnosed.  If your panic attack was  caused by a physical problem, follow your health care provider's suggestions for medicine, referral to a specialist, and lifestyle changes.  If your panic attack was caused by an emotional problem, follow through with counseling from a qualified mental health specialist.  If you feel like you may hurt yourself or others, call 911 and get help right away. This information is not intended to replace advice given to you by your health care provider. Make sure you discuss any questions you have with your health care provider. Document  Released: 11/05/2005 Document Revised: 12/14/2016 Document Reviewed: 12/14/2016 Elsevier Interactive Patient Education  [employer].

## 2018-08-29 NOTE — Progress Notes (Signed)
Jennifer [name] is a 52 y.o. female who presents to [facility] [place] today for right sided salivary gland pain and panic attacks.    Jennifer [name] notes a 6-week history of intermittent pain and swelling of her right jaw.  Points to the area inferior to the angle of the jaw and sometimes onto the parotid gland area.  She notes this will sometimes become swollen and painful.  She notes that her symptoms are consistent with previous episodes of salivary stones.  She typically will treat this with sour candy which usually will cause her to salivate which is then painful and resulted in a foul taste then relieving her symptoms.  She notes however this is been becoming more frequent over the past few weeks.  She denies fevers or chills vomiting or diarrhea.  Additionally she has a history of panic attacks.  She notes that she has about 2 panic attacks per month.  These typically last only a few minutes and do not typically interfere with work very much.  She did have a panic attack at work yesterday and is here for evaluation.  She like to avoid medication if possible.  She denies any history of diagnosis of panic or anxiety disorder or medication use in the past.  ROS as above:  Exam:  BP 119/77   Pulse 98   Ht _0  (1.702 m)   Wt 169 lb (76.7 kg)   LMP 08/02/2014   BMI 26.47 kg/m  Wt Readings from Last 5 Encounters:  08/29/18 169 lb (76.7 kg)  02/23/18 170 lb (77.1 kg)  01/13/18 172 lb (78 kg)  09/30/17 175 lb (79.4 kg)  10/01/16 167 lb (75.8 kg)    Gen: Well NAD HEENT: EOMI,  MMM no oral lesions or swelling.  No neck masses palpated. Lungs: Normal work of breathing. CTABL Heart: RRR no MRG Abd: NABS, Soft. Nondistended, Nontender Exts: Brisk capillary refill, warm and well perfused.  Psych alert and oriented normal speech thought process and affect.  Depression screen [facility] 2/9  08/29/2018 08/07/2017  Decreased Interest 0 0  Down, Depressed, Hopeless 0 0  PHQ - 2 Score 0 0  Altered sleeping 0 1  Tired, decreased energy 0 1  Change in appetite 0 0  Feeling bad or failure about yourself  0 0  Trouble concentrating 0 0  Moving slowly or fidgety/restless 0 0  Suicidal thoughts 0 0  PHQ-9 Score 0 2  Difficult doing work/chores Not difficult at all Not difficult at all   GAD 7 : Generalized Anxiety Score 08/29/2018  Nervous, Anxious, on Edge 2  Control/stop worrying 1  Worry too much - different things 1  Trouble relaxing 3  Restless 3  Easily annoyed or irritable 1  Afraid - awful might happen 0  Total GAD 7 Score 11  Anxiety Difficulty Very difficult      Assessment and Plan: 52 y.o. female with  Jaw swelling and pain.  Consistent with salivary stones or sialadenitis.  However there is a current outbreak of mumps in the community and I do think it is worthwhile doing a cursory lab investigation for mom's with mumps antibody IgM and MMR titer IgG.  If mumps is negative next step would be trial of oral antibiotics.  However if symptoms continue given the recurrent nature I think she benefit from further evaluation likely with ENT.  Patient will update with how she does following course of  oral antibiotics which she will start as soon as mumps IgM comes back negative.  Also check CBC CMP lipase amylase.  Additionally she has panic attacks.  She is having 2 panic attacks per month with some baseline anxiety symptoms.  We discussed options.  She like to avoid occasions at this time.  I think counseling is a good idea.  She will try some self-guided cognitive behavioral therapy.  She will consider formal counseling in the future and will let me know if she like a referral.  Recheck as needed.   Orders Placed This Encounter  Procedures  . Mumps Antibody, IgM  . Measles/Mumps/Rubella Immunity  . CBC with Differential/Platelet  . COMPLETE METABOLIC PANEL WITH GFR    . Lipase  . Amylase   Meds ordered this encounter  Medications  . cefdinir (OMNICEF) 300 MG capsule    Sig: Take 1 capsule (300 mg total) by mouth 2 (two) times daily.    Dispense:  14 capsule    Refill:  0     Historical information moved to improve visibility of documentation.  Past Medical History:  Diagnosis Date  . Fibroids    Past Surgical History:  Procedure Laterality Date  . HEEL SPUR SURGERY Right   . SHOULDER SURGERY Right    Social History   Tobacco Use  . Smoking status: Never Smoker  . Smokeless tobacco: Never Used  Substance Use Topics  . Alcohol use: No    Alcohol/week: 0.0 standard drinks   family history includes Cancer in her paternal grandmother; Diabetes in her father; Hyperlipidemia in her mother.  Medications: Current Outpatient Medications  Medication Sig Dispense Refill  . clobetasol cream (TEMOVATE) 0.05 % Apply to affected area BID for 1 week then daily for one week 30 g 5  . fluticasone (FLONASE) 50 MCG/ACT nasal spray Place into both nostrils daily.    Marland Kitchen levocetirizine (XYZAL) 5 MG tablet Take 5 mg every evening by mouth.    . cefdinir (OMNICEF) 300 MG capsule Take 1 capsule (300 mg total) by mouth 2 (two) times daily. 14 capsule 0  . ipratropium (ATROVENT) 0.06 % nasal spray Place 2 sprays into both nostrils every 4 (four) hours as needed for rhinitis. (Patient not taking: Reported on 08/29/2018) 10 mL 6   No current facility-administered medications for this visit.    Allergies  Allergen Reactions  . Hydrocodone-Acetaminophen   . Influenza Vaccines   . Chloraprep One Step [Chlorhexidine Gluconate] Rash     Discussed warning signs or symptoms. Please see discharge instructions. Patient expresses understanding.

## 2018-09-01 LAB — CBC WITH DIFFERENTIAL/PLATELET
BASOS ABS: 72 {cells}/uL (ref 0–200)
BASOS PCT: 0.9 %
EOS PCT: 3.8 %
Eosinophils Absolute: 304 cells/uL (ref 15–500)
HCT: 38 % (ref 35.0–45.0)
HEMOGLOBIN: 12.6 g/dL (ref 11.7–15.5)
LYMPHS ABS: 2000 {cells}/uL (ref 850–3900)
MCH: 30.7 pg (ref 27.0–33.0)
MCHC: 33.2 g/dL (ref 32.0–36.0)
MCV: 92.5 fL (ref 80.0–100.0)
MPV: 11.4 fL (ref 7.5–12.5)
Monocytes Relative: 8.2 %
NEUTROS PCT: 62.1 %
Neutro Abs: 4968 cells/uL (ref 1500–7800)
PLATELETS: 241 10*3/uL (ref 140–400)
RBC: 4.11 10*6/uL (ref 3.80–5.10)
RDW: 13.2 % (ref 11.0–15.0)
TOTAL LYMPHOCYTE: 25 %
WBC mixed population: 656 cells/uL (ref 200–950)
WBC: 8 10*3/uL (ref 3.8–10.8)

## 2018-09-01 LAB — COMPLETE METABOLIC PANEL WITH GFR
AG RATIO: 1.6 (calc) (ref 1.0–2.5)
ALBUMIN MSPROF: 4.4 g/dL (ref 3.6–5.1)
ALT: 13 U/L (ref 6–29)
AST: 18 U/L (ref 10–35)
Alkaline phosphatase (APISO): 98 U/L (ref 33–130)
BUN: 14 mg/dL (ref 7–25)
CALCIUM: 9.7 mg/dL (ref 8.6–10.4)
CO2: 30 mmol/L (ref 20–32)
Chloride: 103 mmol/L (ref 98–110)
Creat: 0.87 mg/dL (ref 0.50–1.05)
GFR, EST AFRICAN AMERICAN: 90 mL/min/{1.73_m2} (ref >=60)
GFR, EST NON AFRICAN AMERICAN: 78 mL/min/{1.73_m2} (ref >=60)
Globulin: 2.7 g/dL (calc) (ref 1.9–3.7)
Glucose, Bld: 83 mg/dL (ref 65–139)
POTASSIUM: 4 mmol/L (ref 3.5–5.3)
Sodium: 141 mmol/L (ref 135–146)
TOTAL PROTEIN: 7.1 g/dL (ref 6.1–8.1)
Total Bilirubin: 0.5 mg/dL (ref 0.2–1.2)

## 2018-09-01 LAB — LIPASE: Lipase: 21 U/L (ref 7–60)

## 2018-09-01 LAB — MEASLES/MUMPS/RUBELLA IMMUNITY
Mumps IgG: 135 AU/mL
Rubella: 14.2 index
Rubeola IgG: 38.1 AU/mL

## 2018-09-01 LAB — MUMPS ANTIBODY, IGM

## 2018-09-01 LAB — AMYLASE: Amylase: 227 U/L — ABNORMAL HIGH (ref 21–101)

## 2018-09-01 NOTE — Progress Notes (Signed)
Pt has seen results on MyChart and message also sent for patient to call back if any questions.

## 2018-09-30 ENCOUNTER — Encounter: Payer: Self-pay | Admitting: Family Medicine

## 2018-09-30 ENCOUNTER — Ambulatory Visit: Payer: BLUE CROSS/BLUE SHIELD | Admitting: Family Medicine

## 2018-09-30 VITALS — BP 109/77 | HR 59 | Ht 67.0 in | Wt 166.0 lb

## 2018-09-30 DIAGNOSIS — Z23 Encounter for immunization: Secondary | ICD-10-CM

## 2018-09-30 DIAGNOSIS — L290 Pruritus ani: Secondary | ICD-10-CM

## 2018-09-30 DIAGNOSIS — Z0000 Encounter for general adult medical examination without abnormal findings: Secondary | ICD-10-CM | POA: Diagnosis not present

## 2018-09-30 DIAGNOSIS — Z124 Encounter for screening for malignant neoplasm of cervix: Secondary | ICD-10-CM

## 2018-09-30 DIAGNOSIS — Z6826 Body mass index (BMI) 26.0-26.9, adult: Secondary | ICD-10-CM

## 2018-09-30 DIAGNOSIS — Z1239 Encounter for other screening for malignant neoplasm of breast: Secondary | ICD-10-CM

## 2018-09-30 MED ORDER — CLOBETASOL PROPIONATE 0.05 % EX CREA
TOPICAL_CREAM | CUTANEOUS | 5 refills | Status: DC
Start: 2018-09-30 — End: 2022-02-09

## 2018-09-30 NOTE — Patient Instructions (Addendum)
Thank you for coming in today. Continue exercise and healthy diet.  Schedule mammogram and pap smear.  Continue Vit D and weight bearing exercise.    Recheck with me yearly if all is well.   Return sooner if needed.

## 2018-09-30 NOTE — Progress Notes (Signed)
Jennifer [name] is a 52 y.o. female who presents to [place] [place] today for well adult visit.  Jennifer [name] is doing well.  She exercises regularly and tries to eat a healthy diet.  She would like to have her cervical cancer screening done at OB/GYN.  She notes that she is due for mammogram.  She does not drink alcohol and is happy with how things are going in her life.  She would like to avoid medications if possible.   ROS as above:  Past Medical History:  Diagnosis Date  . Fibroids    Past Surgical History:  Procedure Laterality Date  . HEEL SPUR SURGERY Right   . SHOULDER SURGERY Right    Social History   Tobacco Use  . Smoking status: Never Smoker  . Smokeless tobacco: Never Used  Substance Use Topics  . Alcohol use: No    Alcohol/week: 0.0 standard drinks   family history includes Cancer in her paternal grandmother; Diabetes in her father; Hyperlipidemia in her mother.  Medications: Current Outpatient Medications  Medication Sig Dispense Refill  . cholecalciferol (VITAMIN D3) 25 MCG (1000 UT) tablet Take 1,000 Units by mouth daily.    . clobetasol cream (TEMOVATE) 0.05 % Apply to affected area BID for 1 week then daily for one week 30 g 5  . fluticasone (FLONASE) 50 MCG/ACT nasal spray Place into both nostrils daily.    Marland Kitchen levocetirizine (XYZAL) 5 MG tablet Take 5 mg every evening by mouth.     No current facility-administered medications for this visit.    Allergies  Allergen Reactions  . Hydrocodone-Acetaminophen   . Influenza Vaccines   . Chloraprep One Step [Chlorhexidine Gluconate] Rash    Health Maintenance Health Maintenance  Topic Date Due  . PAP SMEAR  03/06/2018  . MAMMOGRAM  09/28/2019  . COLONOSCOPY  11/07/2020  . TETANUS/TDAP  03/08/2022  . HIV Screening  Completed     Exam:  BP 109/77   Pulse (!) 59   Ht 5\' 7"  (1.702 m)   Wt 166  lb (75.3 kg)   LMP 08/02/2014   BMI 26.00 kg/m  Wt Readings from Last 5 Encounters:  09/30/18 166 lb (75.3 kg)  08/29/18 169 lb (76.7 kg)  02/23/18 170 lb (77.1 kg)  01/13/18 172 lb (78 kg)  09/30/17 175 lb (79.4 kg)      Gen: Well NAD HEENT: EOMI,  MMM Lungs: Normal work of breathing. CTABL Heart: RRR no MRG Abd: NABS, Soft. Nondistended, Nontender Exts: Brisk capillary refill, warm and well perfused.  Psych: Alert and oriented normal speech thought process and affect.  Depression screen [facility] 2/9 08/29/2018 08/07/2017  Decreased Interest 0 0  Down, Depressed, Hopeless 0 0  PHQ - 2 Score 0 0  Altered sleeping 0 1  Tired, decreased energy 0 1  Change in appetite 0 0  Feeling bad or failure about yourself  0 0  Trouble concentrating 0 0  Moving slowly or fidgety/restless 0 0  Suicidal thoughts 0 0  PHQ-9 Score 0 2  Difficult doing work/chores Not difficult at all Not difficult at all        Lab and Radiology Results Recent labs reviewed.  DEXA scan reviewed.  Mammogram reviewed.    Assessment and Plan: 52 y.o. female with  Well adult.  Doing well.  Discuss current health.  Recommend continue healthy lifestyle including exercise and careful diet.  Continue vitamin D for  osteoporosis prevention and continue regular exercise.  Continue evaluation for hyperlipidemia.  Patient not at threshold for statin medication with labs obtained January 2019.  Health has improved since then.  Do not see the utility in checking now.  Consider recheck next year.   Orders Placed This Encounter  Procedures  . MM 3D SCREEN BREAST BILATERAL    Standing Status:   Future    Standing Expiration Date:   12/01/2019    Order Specific Question:   Reason for Exam (SYMPTOM  OR DIAGNOSIS REQUIRED)    Answer:   screen breast cancer    Order Specific Question:   Is the patient pregnant?    Answer:   No    Order Specific Question:   Preferred imaging location?    Answer:   [facility]    . Ambulatory referral to Obstetrics / Gynecology    Referral Priority:   Routine    Referral Type:   Consultation    Referral Reason:   Specialty Services Required    Requested Specialty:   Obstetrics and Gynecology    Number of Visits Requested:   1   Meds ordered this encounter  Medications  . clobetasol cream (TEMOVATE) 0.05 %    Sig: Apply to affected area BID for 1 week then daily for one week    Dispense:  30 g    Refill:  5     Discussed warning signs or symptoms. Please see discharge instructions. Patient expresses understanding.

## 2018-10-09 ENCOUNTER — Encounter: Payer: Self-pay | Admitting: Family Medicine

## 2018-10-15 ENCOUNTER — Ambulatory Visit: Payer: BLUE CROSS/BLUE SHIELD

## 2018-10-15 ENCOUNTER — Ambulatory Visit (INDEPENDENT_AMBULATORY_CARE_PROVIDER_SITE_OTHER): Payer: BLUE CROSS/BLUE SHIELD

## 2018-10-15 DIAGNOSIS — Z1231 Encounter for screening mammogram for malignant neoplasm of breast: Secondary | ICD-10-CM

## 2018-10-15 DIAGNOSIS — Z1239 Encounter for other screening for malignant neoplasm of breast: Secondary | ICD-10-CM

## 2018-10-20 ENCOUNTER — Ambulatory Visit (INDEPENDENT_AMBULATORY_CARE_PROVIDER_SITE_OTHER): Payer: BLUE CROSS/BLUE SHIELD | Admitting: Obstetrics & Gynecology

## 2018-10-20 ENCOUNTER — Encounter: Payer: Self-pay | Admitting: Obstetrics & Gynecology

## 2018-10-20 VITALS — BP 116/68 | HR 75 | Resp 16 | Ht 66.5 in | Wt 171.0 lb

## 2018-10-20 DIAGNOSIS — N950 Postmenopausal bleeding: Secondary | ICD-10-CM

## 2018-10-20 DIAGNOSIS — Z1151 Encounter for screening for human papillomavirus (HPV): Secondary | ICD-10-CM | POA: Diagnosis not present

## 2018-10-20 DIAGNOSIS — Z01419 Encounter for gynecological examination (general) (routine) without abnormal findings: Secondary | ICD-10-CM | POA: Diagnosis not present

## 2018-10-20 DIAGNOSIS — Z124 Encounter for screening for malignant neoplasm of cervix: Secondary | ICD-10-CM

## 2018-10-20 MED ORDER — ESTRADIOL 0.1 MG/GM VA CREA: TOPICAL_CREAM | VAGINAL | 2 refills | Status: DC

## 2018-10-20 NOTE — Progress Notes (Signed)
Subjective:     Jennifer [name] is a 52 y.o. female here for a routine exam.  Current complaints: Pt in menopause--s hot a lot but no hot flashes.  Pt had spotting at [facility] day and also last week.  Last week she had spotting after intercourse and again 3 days later (BRB)   Gynecologic History Patient's last menstrual period was 08/02/2014. Contraception: post menopausal status Last Pap: 2016. Results were: ASCUS with NEGATIVE HPV Last mammogram: 09/2018. Results were: normal  Obstetric History OB History  Gravida Para Term Preterm AB Living  1 1 1     1   SAB TAB Ectopic Multiple Live Births               # Outcome Date GA Lbr Len/2nd Weight Sex Delivery Anes PTL Lv  1 Term             Obstetric Comments  Has an adopted daughter     The following portions of the patient's history were reviewed and updated as appropriate: allergies, current medications, past family history, past medical history, past social history, past surgical history and problem list.  Review of Systems Pertinent items noted in HPI and remainder of comprehensive ROS otherwise negative.    Objective:      Vitals:   10/20/18 1351  BP: 116/68  Pulse: 75  Resp: 16  Weight: 171 lb (77.6 kg)  Height: 5' 6.5" (1.689 m)   Vitals:  WNL General appearance: alert, cooperative and no distress  HEENT: Normocephalic, without obvious abnormality, atraumatic Eyes: negative Throat: lips, mucosa, and tongue normal; teeth and gums normal  Respiratory: Clear to auscultation bilaterally  CV: Regular rate and rhythm  Breasts:  Normal appearance, no masses or tenderness, no nipple retraction or dimpling  GI: Soft, non-tender; bowel sounds normal; no masses,  no organomegaly  GU: External Genitalia:  Tanner V, no lesion Urethra:  No prolapse   Vagina: Pink, normal rugae, no blood or discharge Irritation at the introitus but not a laceration.  No bleeding on exam.  Mildly tender.    Cervix: No CMT, no lesion  Uterus:   Normal size and contour, non tender  Adnexa: Normal, no masses, non tender  Musculoskeletal: No edema, redness or tenderness in the calves or thighs  Skin: No lesions or rash  Lymphatic: Axillary adenopathy: none     Psychiatric: Normal mood and behavior    Assessment:    Healthy female exam.   PMB   Plan:    Yearly Mammograms Pap Smear today TVUS to eval endometrial stripe Estradiol nightly for one week and then twice a week to introitus that is atrohpic and mildly painful RTC one month.

## 2018-10-23 LAB — CYTOLOGY - PAP
DIAGNOSIS: UNDETERMINED — AB
HPV (WINDOPATH): DETECTED — AB

## 2018-10-27 ENCOUNTER — Ambulatory Visit (INDEPENDENT_AMBULATORY_CARE_PROVIDER_SITE_OTHER): Payer: BLUE CROSS/BLUE SHIELD

## 2018-10-27 DIAGNOSIS — N950 Postmenopausal bleeding: Secondary | ICD-10-CM

## 2018-10-27 DIAGNOSIS — N939 Abnormal uterine and vaginal bleeding, unspecified: Secondary | ICD-10-CM | POA: Diagnosis not present

## 2018-10-28 ENCOUNTER — Telehealth: Payer: Self-pay | Admitting: *Deleted

## 2018-10-28 NOTE — Telephone Encounter (Signed)
Pt notified of pap results and went ahead and scheduled her Colpo with Dr Gala Romney before the end of the year.

## 2018-10-28 NOTE — Telephone Encounter (Signed)
-----   Message from Guss Bunde, MD sent at 10/27/2018 11:36 AM EST ----- Thin endometrial stripe (4 mm).  If bleeding reoccurs would proceed with endometrial sampling.

## 2018-11-03 ENCOUNTER — Ambulatory Visit: Payer: BLUE CROSS/BLUE SHIELD | Admitting: Obstetrics & Gynecology

## 2018-11-03 ENCOUNTER — Encounter: Payer: Self-pay | Admitting: Obstetrics & Gynecology

## 2018-11-03 VITALS — BP 107/73 | HR 69 | Resp 16 | Ht 65.5 in | Wt 171.0 lb

## 2018-11-03 DIAGNOSIS — R87610 Atypical squamous cells of undetermined significance on cytologic smear of cervix (ASC-US): Secondary | ICD-10-CM

## 2018-11-03 DIAGNOSIS — R87810 Cervical high risk human papillomavirus (HPV) DNA test positive: Secondary | ICD-10-CM

## 2018-11-03 DIAGNOSIS — Z01812 Encounter for preprocedural laboratory examination: Secondary | ICD-10-CM

## 2018-11-03 DIAGNOSIS — R87619 Unspecified abnormal cytological findings in specimens from cervix uteri: Secondary | ICD-10-CM

## 2018-11-03 DIAGNOSIS — N870 Mild cervical dysplasia: Secondary | ICD-10-CM | POA: Diagnosis not present

## 2018-11-03 NOTE — Progress Notes (Signed)
Colposcopy Procedure Note  Indications: Pap smear 1 months ago showed: ASCUS with POSITIVE high risk HPV. The prior pap showed ASCUS with NEGATIVE high risk HPV.  Prior cervical/vaginal disease: normal exam without visible pathology. Prior cervical treatment: no treatment.  Procedure Details  The risks and benefits of the procedure and Written informed consent obtained.  Speculum placed in vagina and excellent visualization of cervix achieved, cervix swabbed x 3 with acetic acid solution.  Findings: Cervix: acetowhite lesion(s) noted at 3 o'clock; cervix swabbed with Lugol's solution and SCJ visualized - lesion at 3 o'clock. Vaginal inspection: vaginal colposcopy not performed. Vulvar colposcopy: vulvar colposcopy not performed.  Specimens: ECC and cervical biopsoy at 3 o'clock  Complications: none.  Plan: Specimens labelled and sent to Pathology. Will base further treatment on Pathology findings.

## 2018-11-17 ENCOUNTER — Encounter: Payer: Self-pay | Admitting: Obstetrics & Gynecology

## 2018-11-17 DIAGNOSIS — R87610 Atypical squamous cells of undetermined significance on cytologic smear of cervix (ASC-US): Secondary | ICD-10-CM | POA: Insufficient documentation

## 2018-11-17 DIAGNOSIS — R87810 Cervical high risk human papillomavirus (HPV) DNA test positive: Secondary | ICD-10-CM

## 2018-11-20 ENCOUNTER — Ambulatory Visit: Payer: BLUE CROSS/BLUE SHIELD | Admitting: Obstetrics & Gynecology

## 2019-03-12 ENCOUNTER — Ambulatory Visit: Payer: BLUE CROSS/BLUE SHIELD | Admitting: Family Medicine

## 2019-03-12 ENCOUNTER — Encounter: Payer: Self-pay | Admitting: Family Medicine

## 2019-03-12 ENCOUNTER — Other Ambulatory Visit: Payer: Self-pay

## 2019-03-12 VITALS — BP 108/65 | HR 67 | Temp 98.0°F | Wt 173.0 lb

## 2019-03-12 DIAGNOSIS — L739 Follicular disorder, unspecified: Secondary | ICD-10-CM | POA: Diagnosis not present

## 2019-03-12 MED ORDER — DOXYCYCLINE HYCLATE 100 MG PO TABS: 100.0000 mg | ORAL_TABLET | Freq: Two times a day (BID) | ORAL | 0 refills | Status: DC

## 2019-03-12 NOTE — Progress Notes (Signed)
Jennifer [name] is a 53 y.o. female who presents to [place]: [place] today for painful red bump on left shin.  Starting Monday patient noted a small red papular bump on the distal anterior left shin.  She notes this worsened slowly and worsened until yesterday we became more swollen and more painful.  She tried applying some antibiotic ointment which has not been helpful.  She notes is not itchy but more painful.  She cannot recall a specific bug bite or puncture wound.  She feels well otherwise with no fevers or chills.  No nausea vomiting diarrhea chest pain or palpitations.  ROS as above:  Exam:  BP 108/65   Pulse 67   Temp 98 F (36.7 C) (Oral)   Wt 173 lb (78.5 kg)   LMP 08/02/2014   BMI 28.35 kg/m  Wt Readings from Last 5 Encounters:  03/12/19 173 lb (78.5 kg)  11/03/18 171 lb (77.6 kg)  10/20/18 171 lb (77.6 kg)  09/30/18 166 lb (75.3 kg)  08/29/18 169 lb (76.7 kg)    Gen: Well NAD Skin: Small erythematous papule left anterior distal shin with no fluctuance.  No surrounding induration.  Mildly tender to palpation.      Assessment and Plan: 53 y.o. female with left distal shin papule likely folliculitis.  Not yet developed into a full abscess with no fluctuance.  Plan for empiric treatment with doxycycline.  Discussed avoiding using calcium or iron with doxycycline and discussed using sunscreen.  Additionally if worsening or developing into true abscess patient return for abscess incision and drainage.  Right now unfortunately it is not ready for drainage and I am hopeful that we are catching this early enough that antibiotics will work.   PDMP not reviewed this encounter. No orders of the defined types were placed in this encounter.  Meds ordered this encounter  Medications  . doxycycline (VIBRA-TABS) 100 MG tablet    Sig: Take 1 tablet (100 mg total) by  mouth 2 (two) times daily.    Dispense:  14 tablet    Refill:  0     Historical information moved to improve visibility of documentation.  Past Medical History:  Diagnosis Date  . Fibroids    Past Surgical History:  Procedure Laterality Date  . HEEL SPUR SURGERY Right   . SHOULDER SURGERY Right    Social History   Tobacco Use  . Smoking status: Never Smoker  . Smokeless tobacco: Never Used  Substance Use Topics  . Alcohol use: No    Alcohol/week: 0.0 standard drinks   family history includes Cancer in her paternal grandmother; Diabetes in her father; Hyperlipidemia in her mother.  Medications: Current Outpatient Medications  Medication Sig Dispense Refill  . cholecalciferol (VITAMIN D3) 25 MCG (1000 UT) tablet Take 1,000 Units by mouth daily.    . clobetasol cream (TEMOVATE) 0.05 % Apply to affected area BID for 1 week then daily for one week 30 g 5  . estradiol (ESTRACE VAGINAL) 0.1 MG/GM vaginal cream Apply pea sized amount to affected area daily for 1 week then twice a week until seen next. 42.5 g 2  . fluticasone (FLONASE) 50 MCG/ACT nasal spray Place into both nostrils daily.    Marland Kitchen levocetirizine (XYZAL) 5 MG tablet Take 5 mg every evening by mouth.    . doxycycline (VIBRA-TABS) 100 MG tablet Take 1 tablet (100 mg total) by mouth 2 (two) times daily. 14 tablet  0   No current facility-administered medications for this visit.    Allergies  Allergen Reactions  . Hydrocodone-Acetaminophen   . Influenza Vaccines   . Chloraprep One Step [Chlorhexidine Gluconate] Rash     Discussed warning signs or symptoms. Please see discharge instructions. Patient expresses understanding.

## 2019-03-12 NOTE — Patient Instructions (Signed)
Thank you for coming in today. Take doxycycline twice daily for folliculitis.  Ok to continue antibiotic ointment however I dont think it will help much and may start causing a skin allergy.   If it worsens we may need to drain it.    Folliculitis  Folliculitis is inflammation of the hair follicles. Folliculitis most commonly occurs on the scalp, thighs, legs, back, and buttocks. However, it can occur anywhere on the body. What are the causes? This condition may be caused by:  A bacterial infection (common).  A fungal infection.  A viral infection.  Coming into contact with certain chemicals, especially oils and tars.  Shaving or waxing.  Applying greasy ointments or creams to your skin often. Long-lasting folliculitis and folliculitis that keeps coming back can be caused by bacteria that live in the nostrils. What increases the risk? This condition is more likely to develop in people with:  A weakened immune system.  Diabetes.  Obesity. What are the signs or symptoms? Symptoms of this condition include:  Redness.  Soreness.  Swelling.  Itching.  Small white or yellow, pus-filled, itchy spots (pustules) that appear over a reddened area. If there is an infection that goes deep into the follicle, these may develop into a boil (furuncle).  A group of closely packed boils (carbuncle). These tend to form in hairy, sweaty areas of the body. How is this diagnosed? This condition is diagnosed with a skin exam. To find what is causing the condition, your health care provider may take a sample of one of the pustules or boils for testing. How is this treated? This condition may be treated by:  Applying warm compresses to the affected areas.  Taking an antibiotic medicine or applying an antibiotic medicine to the skin.  Applying or bathing with an antiseptic solution.  Taking an over-the-counter medicine to help with itching.  Having a procedure to drain any pustules  or boils. This may be done if a pustule or boil contains a lot of pus or fluid.  Laser hair removal. This may be done to treat long-lasting folliculitis. Follow these instructions at home:  If directed, apply heat to the affected area as often as told by your health care provider. Use the heat source that your health care provider recommends, such as a moist heat pack or a heating pad. ? Place a towel between your skin and the heat source. ? Leave the heat on for 20-30 minutes. ? Remove the heat if your skin turns bright red. This is especially important if you are unable to feel pain, heat, or cold. You may have a greater risk of getting burned.  If you were prescribed an antibiotic medicine, use it as told by your health care provider. Do not stop using the antibiotic even if you start to feel better.  Take over-the-counter and prescription medicines only as told by your health care provider.  Do not shave irritated skin.  Keep all follow-up visits as told by your health care provider. This is important. Get help right away if:  You have more redness, swelling, or pain in the affected area.  Red streaks are spreading from the affected area.  You have a fever. This information is not intended to replace advice given to you by your health care provider. Make sure you discuss any questions you have with your health care provider. Document Released: 01/14/2002 Document Revised: 05/25/2016 Document Reviewed: 08/26/2015 Elsevier Interactive Patient Education  2019 [employer].

## 2019-03-16 ENCOUNTER — Encounter: Payer: Self-pay | Admitting: Family Medicine

## 2019-03-17 ENCOUNTER — Ambulatory Visit: Payer: BLUE CROSS/BLUE SHIELD | Admitting: Family Medicine

## 2019-03-17 ENCOUNTER — Encounter: Payer: Self-pay | Admitting: Family Medicine

## 2019-03-17 VITALS — BP 113/77 | HR 60 | Temp 97.8°F | Wt 175.0 lb

## 2019-03-17 DIAGNOSIS — L02416 Cutaneous abscess of left lower limb: Secondary | ICD-10-CM | POA: Diagnosis not present

## 2019-03-17 MED ORDER — SULFAMETHOXAZOLE-TRIMETHOPRIM 800-160 MG PO TABS: 1.0000 | ORAL_TABLET | Freq: Two times a day (BID) | ORAL | 0 refills | Status: DC

## 2019-03-17 NOTE — Telephone Encounter (Signed)
Patient scheduled.

## 2019-03-17 NOTE — Progress Notes (Signed)
Jennifer [name] is a 53 y.o. female who presents to [place]: [place] today for abscess or folliculitis.  Patient was seen on March 12, 2019 for left leg erythematous nodule thought to be folliculitis not yet abscess.  She had empiric treatment with doxycycline.  Unfortunately she communicate with me yesterday that the nodule had enlarged and become somewhat painful.  She sent a picture which is attached below.  She was advised to return to clinic for recheck and reevaluation and possible incision and drainage of likely abscess.   ROS as above:  Exam:  BP 113/77   Pulse 60   Temp 97.8 F (36.6 C) (Oral)   Wt 175 lb (79.4 kg)   LMP 08/02/2014   BMI 28.68 kg/m  Wt Readings from Last 5 Encounters:  03/17/19 175 lb (79.4 kg)  03/12/19 173 lb (78.5 kg)  11/03/18 171 lb (77.6 kg)  10/20/18 171 lb (77.6 kg)  09/30/18 166 lb (75.3 kg)    Gen: Well NAD HEENT: EOMI,  MMM Lungs: Normal work of breathing. CTABL Heart: RRR no MRG Abd: NABS, Soft. Nondistended, Nontender Exts: Brisk capillary refill, warm and well perfused.  Skin: Erythematous papule anterior left shin with central fluctuance and surrounding erythema and induration.  Tender to palpation.      Procedure: Abscess incision and drainage. Consent obtained and timeout performed. Skin cleaned with alcohol, and cold spray applied. 1 mL of lidocaine with epi injected achieving good anesthesia. Skin was again cleaned with alcohol. A sharp incision was made to the area of fluctuance. The incision was widened and pus was expressed. Pus was cultured. Blunt dissection was used to break up loculations. Further pus was expressed. Patient tolerated the procedure well. A dressing was applied    Assessment and Plan: 53 y.o. female with left anterior shin abscess status post incision and drainage.  Patient had failure of  oral doxycycline.  Will culture pus to hopefully dictate further antibiotic regimen.  Will switch empirically to Bactrim.  Recheck if not improving.  PDMP not reviewed this encounter. Orders Placed This Encounter  Procedures  . Wound culture    Order Specific Question:   Source    Answer:   left shin abscess   Meds ordered this encounter  Medications  . sulfamethoxazole-trimethoprim (BACTRIM DS) 800-160 MG tablet    Sig: Take 1 tablet by mouth 2 (two) times daily.    Dispense:  14 tablet    Refill:  0     Historical information moved to improve visibility of documentation.  Past Medical History:  Diagnosis Date  . Fibroids    Past Surgical History:  Procedure Laterality Date  . HEEL SPUR SURGERY Right   . SHOULDER SURGERY Right    Social History   Tobacco Use  . Smoking status: Never Smoker  . Smokeless tobacco: Never Used  Substance Use Topics  . Alcohol use: No    Alcohol/week: 0.0 standard drinks   family history includes Cancer in her paternal grandmother; Diabetes in her father; Hyperlipidemia in her mother.  Medications: Current Outpatient Medications  Medication Sig Dispense Refill  . cholecalciferol (VITAMIN D3) 25 MCG (1000 UT) tablet Take 1,000 Units by mouth daily.    . clobetasol cream (TEMOVATE) 0.05 % Apply to affected area BID for 1 week then daily for one week 30 g 5  . fluticasone (FLONASE) 50 MCG/ACT nasal spray Place into both nostrils daily.    Marland Kitchen  estradiol (ESTRACE VAGINAL) 0.1 MG/GM vaginal cream Apply pea sized amount to affected area daily for 1 week then twice a week until seen next. (Patient not taking: Reported on 03/17/2019) 42.5 g 2  . levocetirizine (XYZAL) 5 MG tablet Take 5 mg every evening by mouth.    . sulfamethoxazole-trimethoprim (BACTRIM DS) 800-160 MG tablet Take 1 tablet by mouth 2 (two) times daily. 14 tablet 0   No current facility-administered medications for this visit.    Allergies  Allergen Reactions  .  Hydrocodone-Acetaminophen   . Influenza Vaccines   . Chloraprep One Step [Chlorhexidine Gluconate] Rash     Discussed warning signs or symptoms. Please see discharge instructions. Patient expresses understanding.

## 2019-03-17 NOTE — Patient Instructions (Signed)
Thank you for coming in today. STOP doxycycline.  Start Bactrim antibiotic.  Keep the wound clean and covered with ointment.  Recheck as needed.    Skin Abscess  A skin abscess is an infected area on or under your skin that contains a collection of pus and other material. An abscess may also be called a furuncle, carbuncle, or boil. An abscess can occur in or on almost any part of your body. Some abscesses break open (rupture) on their own. Most continue to get worse unless they are treated. The infection can spread deeper into the body and eventually into your blood, which can make you feel ill. Treatment usually involves draining the abscess. What are the causes? An abscess occurs when germs, like bacteria, pass through your skin and cause an infection. This may be caused by:  A scrape or cut on your skin.  A puncture wound through your skin, including a needle injection or insect bite.  Blocked oil or sweat glands.  Blocked and infected hair follicles.  A cyst that forms beneath your skin (sebaceous cyst) and becomes infected. What increases the risk? This condition is more likely to develop in people who:  Have a weak body defense system (immune system).  Have diabetes.  Have dry and irritated skin.  Get frequent injections or use illegal IV drugs.  Have a foreign body in a wound, such as a splinter.  Have problems with their lymph system or veins. What are the signs or symptoms? Symptoms of this condition include:  A painful, firm bump under the skin.  A bump with pus at the top. This may break through the skin and drain. Other symptoms include:  Redness surrounding the abscess site.  Warmth.  Swelling of the lymph nodes (glands) near the abscess.  Tenderness.  A sore on the skin. How is this diagnosed? This condition may be diagnosed based on:  A physical exam.  Your medical history.  A sample of pus. This may be used to find out what is causing the  infection.  Blood tests.  Imaging tests, such as an ultrasound, CT scan, or MRI. How is this treated? A small abscess that drains on its own may not need treatment. Treatment for larger abscesses may include:  Moist heat or heat pack applied to the area several times a day.  A procedure to drain the abscess (incision and drainage).  Antibiotic medicines. For a severe abscess, you may first get antibiotics through an IV and then change to antibiotics by mouth. Follow these instructions at home: Medicines   Take over-the-counter and prescription medicines only as told by your health care provider.  If you were prescribed an antibiotic medicine, take it as told by your health care provider. Do not stop taking the antibiotic even if you start to feel better. Abscess care   If you have an abscess that has not drained, apply heat to the affected area. Use the heat source that your health care provider recommends, such as a moist heat pack or a heating pad. ? Place a towel between your skin and the heat source. ? Leave the heat on for 20-30 minutes. ? Remove the heat if your skin turns bright red. This is especially important if you are unable to feel pain, heat, or cold. You may have a greater risk of getting burned.  Follow instructions from your health care provider about how to take care of your abscess. Make sure you: ? Cover the abscess with  a bandage (dressing). ? Change your dressing or gauze as told by your health care provider. ? Wash your hands with soap and water before you change the dressing or gauze. If soap and water are not available, use hand sanitizer.  Check your abscess every day for signs of a worsening infection. Check for: ? More redness, swelling, or pain. ? More fluid or blood. ? Warmth. ? More pus or a bad smell. General instructions  To avoid spreading the infection: ? Do not share personal care items, towels, or hot tubs with others. ? Avoid making skin  contact with other people.  Keep all follow-up visits as told by your health care provider. This is important. Contact a health care provider if you have:  More redness, swelling, or pain around your abscess.  More fluid or blood coming from your abscess.  Warm skin around your abscess.  More pus or a bad smell coming from your abscess.  A fever.  Muscle aches.  Chills or a general ill feeling. Get help right away if you:  Have severe pain.  See red streaks on your skin spreading away from the abscess. Summary  A skin abscess is an infected area on or under your skin that contains a collection of pus and other material.  A small abscess that drains on its own may not need treatment.  Treatment for larger abscesses may include having a procedure to drain the abscess and taking an antibiotic. This information is not intended to replace advice given to you by your health care provider. Make sure you discuss any questions you have with your health care provider. Document Released: 08/15/2005 Document Revised: 12/19/2017 Document Reviewed: 12/19/2017 Elsevier Interactive Patient Education  2019 [employer].  Sulfamethoxazole; Trimethoprim, SMX-TMP tablets What is this medicine? SULFAMETHOXAZOLE; TRIMETHOPRIM or SMX-TMP (suhl fuh meth OK suh zohl; trye METH oh prim) is a combination of a sulfonamide antibiotic and a second antibiotic, trimethoprim. It is used to treat or prevent certain kinds of bacterial infections. It will not work for colds, flu, or other viral infections. This medicine may be used for other purposes; ask your health care provider or pharmacist if you have questions. COMMON BRAND NAME(S): Bacter-Aid DS, Bactrim, Bactrim DS, Septra, Septra DS What should I tell my health care provider before I take this medicine? They need to know if you have any of these conditions: -anemia -asthma -being treated with anticonvulsants -if you frequently drink alcohol  containing drinks -kidney disease -liver disease -low level of folic acid or [id number] dehydrogenase -poor nutrition or malabsorption -porphyria -severe allergies -thyroid disorder -an unusual or allergic reaction to sulfamethoxazole, trimethoprim, sulfa drugs, other medicines, foods, dyes, or preservatives -pregnant or trying to get pregnant -breast-feeding How should I use this medicine? Take this medicine by mouth with a full glass of water. Follow the directions on the prescription label. Take your medicine at regular intervals. Do not take it more often than directed. Do not skip doses or stop your medicine early. Talk to your pediatrician regarding the use of this medicine in children. Special care may be needed. This medicine has been used in children as young as 75 months of age. Overdosage: If you think you have taken too much of this medicine contact a poison control center or emergency room at once. NOTE: This medicine is only for you. Do not share this medicine with others. What if I miss a dose? If you miss a dose, take it as soon as you can.  If it is almost time for your next dose, take only that dose. Do not take double or extra doses. What may interact with this medicine? Do not take this medicine with any of the following medications: -aminobenzoate potassium -dofetilide -metronidazole This medicine may also interact with the following medications: -ACE inhibitors like benazepril, enalapril, lisinopril, and ramipril -birth control pills -cyclosporine -digoxin -diuretics -indomethacin -medicines for diabetes -methenamine -methotrexate -phenytoin -potassium supplements -pyrimethamine -sulfinpyrazone -tricyclic antidepressants -warfarin This list may not describe all possible interactions. Give your health care provider a list of all the medicines, herbs, non-prescription drugs, or dietary supplements you use. Also tell them if you smoke, drink alcohol,  or use illegal drugs. Some items may interact with your medicine. What should I watch for while using this medicine? Tell your doctor or health care professional if your symptoms do not improve. Drink several glasses of water a day to reduce the risk of kidney problems. Do not treat diarrhea with over the counter products. Contact your doctor if you have diarrhea that lasts more than 2 days or if it is severe and watery. This medicine can make you more sensitive to the sun. Keep out of the sun. If you cannot avoid being in the sun, wear protective clothing and use a sunscreen. Do not use sun lamps or tanning beds/booths. What side effects may I notice from receiving this medicine? Side effects that you should report to your doctor or health care professional as soon as possible: -allergic reactions like skin rash or hives, swelling of the face, lips, or tongue -breathing problems -fever or chills, sore throat -irregular heartbeat, chest pain -joint or muscle pain -pain or difficulty passing urine -red pinpoint spots on skin -redness, blistering, peeling or loosening of the skin, including inside the mouth -unusual bleeding or bruising -unusually weak or tired -yellowing of the eyes or skin Side effects that usually do not require medical attention (report to your doctor or health care professional if they continue or are bothersome): -diarrhea -dizziness -headache -loss of appetite -nausea, vomiting -nervousness This list may not describe all possible side effects. Call your doctor for medical advice about side effects. You may report side effects to FDA at 1-800-FDA-1088. Where should I keep my medicine? Keep out of the reach of children. Store at room temperature between 20 to 25 degrees C (68 to 77 degrees F). Protect from light. Throw away any unused medicine after the expiration date. NOTE: This sheet is a summary. It may not cover all possible information. If you have questions  about this medicine, talk to your doctor, pharmacist, or health care provider.  2019 Elsevier/[employer] (2013-06-12 14:38:26)

## 2019-03-20 LAB — WOUND CULTURE
MICRO NUMBER:: 428475
SPECIMEN QUALITY:: ADEQUATE

## 2019-05-20 DIAGNOSIS — Z86018 Personal history of other benign neoplasm: Secondary | ICD-10-CM | POA: Diagnosis not present

## 2019-05-20 DIAGNOSIS — L720 Epidermal cyst: Secondary | ICD-10-CM | POA: Diagnosis not present

## 2019-05-20 DIAGNOSIS — D225 Melanocytic nevi of trunk: Secondary | ICD-10-CM | POA: Diagnosis not present

## 2019-05-20 DIAGNOSIS — L821 Other seborrheic keratosis: Secondary | ICD-10-CM | POA: Diagnosis not present

## 2019-06-09 LAB — LIPID PANEL
Cholesterol: 185 (ref 0–200)
HDL: 54 (ref 35–70)

## 2019-06-09 LAB — BASIC METABOLIC PANEL: Glucose: 93

## 2019-06-16 ENCOUNTER — Encounter: Payer: Self-pay | Admitting: Family Medicine

## 2019-06-22 ENCOUNTER — Ambulatory Visit (INDEPENDENT_AMBULATORY_CARE_PROVIDER_SITE_OTHER): Payer: BC Managed Care – PPO | Admitting: Family Medicine

## 2019-06-22 VITALS — Wt 171.0 lb

## 2019-06-22 DIAGNOSIS — G8929 Other chronic pain: Secondary | ICD-10-CM

## 2019-06-22 DIAGNOSIS — Z6828 Body mass index (BMI) 28.0-28.9, adult: Secondary | ICD-10-CM

## 2019-06-22 DIAGNOSIS — M25512 Pain in left shoulder: Secondary | ICD-10-CM

## 2019-06-22 DIAGNOSIS — L304 Erythema intertrigo: Secondary | ICD-10-CM | POA: Diagnosis not present

## 2019-06-22 DIAGNOSIS — M25511 Pain in right shoulder: Secondary | ICD-10-CM | POA: Diagnosis not present

## 2019-06-22 DIAGNOSIS — N62 Hypertrophy of breast: Secondary | ICD-10-CM | POA: Diagnosis not present

## 2019-06-22 NOTE — Progress Notes (Signed)
Virtual Visit  via Video Note  I connected with      Jennifer [name] by a video enabled telemedicine application and verified that I am speaking with the correct person using two identifiers.   I discussed the limitations of evaluation and management by telemedicine and the availability of in person appointments. The patient expressed understanding and agreed to proceed.  History of Present Illness: Jennifer [name] is a 53 y.o. female who would like to discuss possibility of breast reduction surgery.   Jennifer [name] has had large breasts since high school and they have grown a bit as she is gotten older.  She has had multiple problems with them including frequent chronic rash under her breast.  She has been seen by dermatologist and has a regimen of antifungal foot powder and antifungal lotion which helps some.  Additionally she notes constant pain in her shoulders and trapezius and neck.  She cannot find a running bra that will fit her appropriately and has pain in her breasts and thoracic back and shoulders with exercise.  As a result she is not exercising as much.  She is attempting to manage and lose weight but that has not helped.  She is willing to consider breast reduction surgery.   Observations/Objective: Wt 171 lb (77.6 kg)   LMP 08/02/2014   BMI 28.02 kg/m  Wt Readings from Last 5 Encounters:  06/22/19 171 lb (77.6 kg)  03/17/19 175 lb (79.4 kg)  03/12/19 173 lb (78.5 kg)  11/03/18 171 lb (77.6 kg)  10/20/18 171 lb (77.6 kg)   Exam: Appearance Normal Speech.  Large breasts  Lab and Radiology Results No results found for this or any previous visit (from the past 82 hour(s)). No results found.   Assessment and Plan: 53 y.o. female with shoulder pain and intertrigo from large breasts.  Patient is failing typical conservative management and is very reasonable to evaluate with plastic surgery for breast reduction surgery.  Recommend that she do some research and reading and talk to  her friends and then let me know which surgeon she picks and I will place a referral to there.  PDMP not reviewed this encounter. No orders of the defined types were placed in this encounter.  No orders of the defined types were placed in this encounter.   Follow Up Instructions:    I discussed the assessment and treatment plan with the patient. The patient was provided an opportunity to ask questions and all were answered. The patient agreed with the plan and demonstrated an understanding of the instructions.   The patient was advised to call back or seek an in-person evaluation if the symptoms worsen or if the condition fails to improve as anticipated.  Time: 15 minutes of intraservice time, with >22 minutes of total time during today's visit.       Historical information moved to improve visibility of documentation.  Past Medical History:  Diagnosis Date  . Fibroids    Past Surgical History:  Procedure Laterality Date  . HEEL SPUR SURGERY Right   . SHOULDER SURGERY Right    Social History   Tobacco Use  . Smoking status: Never Smoker  . Smokeless tobacco: Never Used  Substance Use Topics  . Alcohol use: No    Alcohol/week: 0.0 standard drinks   family history includes Cancer in her paternal grandmother; Diabetes in her father; Hyperlipidemia in her mother.  Medications: Current Outpatient Medications  Medication Sig Dispense Refill  . cholecalciferol (  VITAMIN D3) 25 MCG (1000 UT) tablet Take 1,000 Units by mouth daily.    . clobetasol cream (TEMOVATE) 0.05 % Apply to affected area BID for 1 week then daily for one week 30 g 5  . estradiol (ESTRACE VAGINAL) 0.1 MG/GM vaginal cream Apply pea sized amount to affected area daily for 1 week then twice a week until seen next. (Patient not taking: Reported on 03/17/2019) 42.5 g 2  . fluticasone (FLONASE) 50 MCG/ACT nasal spray Place into both nostrils daily.    Marland Kitchen levocetirizine (XYZAL) 5 MG tablet Take 5 mg every evening  by mouth.    . sulfamethoxazole-trimethoprim (BACTRIM DS) 800-160 MG tablet Take 1 tablet by mouth 2 (two) times daily. 14 tablet 0   No current facility-administered medications for this visit.    Allergies  Allergen Reactions  . Hydrocodone-Acetaminophen   . Influenza Vaccines   . Chloraprep One Step [Chlorhexidine Gluconate] Rash

## 2019-06-29 ENCOUNTER — Encounter: Payer: Self-pay | Admitting: Family Medicine

## 2019-06-29 DIAGNOSIS — M25511 Pain in right shoulder: Secondary | ICD-10-CM

## 2019-06-29 DIAGNOSIS — L304 Erythema intertrigo: Secondary | ICD-10-CM

## 2019-06-29 DIAGNOSIS — N62 Hypertrophy of breast: Secondary | ICD-10-CM

## 2019-06-29 DIAGNOSIS — G8929 Other chronic pain: Secondary | ICD-10-CM

## 2019-07-20 DIAGNOSIS — N62 Hypertrophy of breast: Secondary | ICD-10-CM | POA: Diagnosis not present

## 2019-09-20 HISTORY — PX: REDUCTION MAMMAPLASTY: SUR839

## 2019-09-21 DIAGNOSIS — R001 Bradycardia, unspecified: Secondary | ICD-10-CM | POA: Diagnosis not present

## 2019-09-21 DIAGNOSIS — Z006 Encounter for examination for normal comparison and control in clinical research program: Secondary | ICD-10-CM | POA: Diagnosis not present

## 2019-09-29 DIAGNOSIS — Z006 Encounter for examination for normal comparison and control in clinical research program: Secondary | ICD-10-CM | POA: Diagnosis not present

## 2019-09-29 DIAGNOSIS — Z01812 Encounter for preprocedural laboratory examination: Secondary | ICD-10-CM | POA: Diagnosis not present

## 2019-09-29 DIAGNOSIS — Z20828 Contact with and (suspected) exposure to other viral communicable diseases: Secondary | ICD-10-CM | POA: Diagnosis not present

## 2019-10-01 ENCOUNTER — Encounter: Payer: BC Managed Care – PPO | Admitting: Osteopathic Medicine

## 2019-10-01 ENCOUNTER — Encounter: Payer: BLUE CROSS/BLUE SHIELD | Admitting: Family Medicine

## 2019-10-06 DIAGNOSIS — N62 Hypertrophy of breast: Secondary | ICD-10-CM | POA: Insufficient documentation

## 2019-10-06 DIAGNOSIS — L304 Erythema intertrigo: Secondary | ICD-10-CM | POA: Diagnosis not present

## 2019-10-06 DIAGNOSIS — M542 Cervicalgia: Secondary | ICD-10-CM | POA: Diagnosis not present

## 2019-10-06 DIAGNOSIS — M549 Dorsalgia, unspecified: Secondary | ICD-10-CM | POA: Diagnosis not present

## 2020-01-01 ENCOUNTER — Other Ambulatory Visit: Payer: Self-pay

## 2020-01-01 ENCOUNTER — Encounter: Payer: Self-pay | Admitting: Family Medicine

## 2020-01-01 ENCOUNTER — Ambulatory Visit (INDEPENDENT_AMBULATORY_CARE_PROVIDER_SITE_OTHER): Payer: BC Managed Care – PPO | Admitting: Family Medicine

## 2020-01-01 VITALS — BP 125/79 | HR 73 | Temp 98.0°F | Ht 67.0 in | Wt 177.0 lb

## 2020-01-01 DIAGNOSIS — Z0000 Encounter for general adult medical examination without abnormal findings: Secondary | ICD-10-CM

## 2020-01-01 DIAGNOSIS — Z780 Asymptomatic menopausal state: Secondary | ICD-10-CM

## 2020-01-01 DIAGNOSIS — Z8262 Family history of osteoporosis: Secondary | ICD-10-CM | POA: Diagnosis not present

## 2020-01-01 DIAGNOSIS — Z1211 Encounter for screening for malignant neoplasm of colon: Secondary | ICD-10-CM

## 2020-01-01 DIAGNOSIS — Z87312 Personal history of (healed) stress fracture: Secondary | ICD-10-CM | POA: Diagnosis not present

## 2020-01-01 NOTE — Assessment & Plan Note (Signed)
Well adult Orders Placed This Encounter  Procedures  . DG Bone Density    Standing Status:   Future    Standing Expiration Date:   02/28/2021    Order Specific Question:   Reason for Exam (SYMPTOM  OR DIAGNOSIS REQUIRED)    Answer:   History of stress fracture, family history of early osteoporosis.    Order Specific Question:   Is the patient pregnant?    Answer:   No    Order Specific Question:   Preferred imaging location?    Answer:   [facility]  . Ambulatory referral to Gastroenterology    Referral Priority:   Routine    Referral Type:   Consultation    Referral Reason:   Specialty Services Required    Number of Visits Requested:   1  Screening:  Referral to GI for updated colon cancer screening.  DEXA ordered today with strong family history of osteoporosis and prior history of stress fracture.  Immunizations: UTD Anticipatory guidance/Risk factor reduction:  Continue healthy dietary habits and regular exercise.  She will continue to follow with GYN for routine well female care. Recommend regular dental care.  Additional recommendations per AVS.

## 2020-01-01 NOTE — Progress Notes (Signed)
Jennifer [name] - 54 y.o. female MRN [id number]  Date of birth: 1966/08/30  Subjective Chief Complaint  Patient presents with  . Annual Exam    HPI  Jennifer [name] is a 54 y.o. female here today for annual exam.  Since her last visit she has undergone b/l breast reduction.  She has recovered well from this.  She has complaint of pain in R foot.  Reports hiking and walking uphill last weekend and thinks it is related to this.  It is slowly getting better.  She applied some diclofenac gel that Dr. Georgina Snell had prescribed for her in the past.   She also has some chronic numbness of the 2nd digit of the L foot.  She denies associated pain.  More of an annoyance than anything.   She has history of stress fracture and mother and grandmother with history of osteoporosis.  She would like to have early screening for osteoporosis.    She is due for updated colon cancer screening.  She prefers to do this at [facility].   She is up to date on pap.   She has labs completed through biometric screen at work annually. Prior results scanned in and reviewed today.   She is a non-smoker and denies EtOH use.   She remains active and follows a healthy diet.   Review of Systems  Constitutional: Negative for chills, fever, malaise/fatigue and weight loss.  HENT: Negative for congestion, ear pain and sore throat.   Eyes: Negative for blurred vision, double vision and pain.  Respiratory: Negative for cough and shortness of breath.   Cardiovascular: Negative for chest pain and palpitations.  Gastrointestinal: Negative for abdominal pain, blood in stool, constipation, heartburn and nausea.  Genitourinary: Negative for dysuria and urgency.  Musculoskeletal: Negative for joint pain and myalgias.  Neurological: Negative for dizziness and headaches.  Endo/Heme/Allergies: Does not bruise/bleed easily.  Psychiatric/Behavioral: Negative for depression. The patient is not nervous/anxious and does not have insomnia.         Allergies  Allergen Reactions  . Hydrocodone-Acetaminophen   . Influenza Vaccines   . Chloraprep One Step [Chlorhexidine Gluconate] Rash    Past Medical History:  Diagnosis Date  . Fibroids     Past Surgical History:  Procedure Laterality Date  . HEEL SPUR SURGERY Right   . SHOULDER SURGERY Right     Social History   Socioeconomic History  . Marital status: Married    Spouse name: Not on file  . Number of children: Not on file  . Years of education: Not on file  . Highest education level: Not on file  Occupational History  . Occupation: paralegal  Tobacco Use  . Smoking status: Never Smoker  . Smokeless tobacco: Never Used  Substance and Sexual Activity  . Alcohol use: No    Alcohol/week: 0.0 standard drinks  . Drug use: No  . Sexual activity: Yes    Partners: Male    Birth control/protection: None  Other Topics Concern  . Not on file  Social History Narrative  . Not on file   Social Determinants of Health   Financial Resource Strain:   . Difficulty of Paying Living Expenses: Not on file  Food Insecurity:   . Worried About Charity fundraiser in the Last Year: Not on file  . Ran Out of Food in the Last Year: Not on file  Transportation Needs:   . Lack of Transportation (Medical): Not on file  . Lack of  Transportation (Non-Medical): Not on file  Physical Activity:   . Days of Exercise per Week: Not on file  . Minutes of Exercise per Session: Not on file  Stress:   . Feeling of Stress : Not on file  Social Connections:   . Frequency of Communication with Friends and Family: Not on file  . Frequency of Social Gatherings with Friends and Family: Not on file  . Attends Religious Services: Not on file  . Active Member of Clubs or Organizations: Not on file  . Attends Archivist Meetings: Not on file  . Marital Status: Not on file    Family History  Problem Relation Age of Onset  . Hyperlipidemia Mother   . Diabetes Father   .  Cancer Paternal Grandmother        vulvar cancer    Health Maintenance  Topic Date Due  . MAMMOGRAM  10/15/2020  . COLONOSCOPY  11/07/2020  . PAP SMEAR-Modifier  10/20/2021  . TETANUS/TDAP  03/08/2022  . HIV Screening  Completed    ----------------------------------------------------------------------------------------------------------------------------------------------------------------------------------------------------------------- Physical Exam BP 125/79   Pulse 73   Temp 98 F (36.7 C) (Oral)   Ht 5\' 7"  (1.702 m)   Wt 177 lb (80.3 kg)   LMP 08/02/2014   BMI 27.72 kg/m   Physical Exam Constitutional:      General: She is not in acute distress. HENT:     Head: Normocephalic and atraumatic.     Right Ear: Tympanic membrane normal.     Left Ear: Tympanic membrane normal.     Nose: Nose normal.     Mouth/Throat:     Mouth: Mucous membranes are moist.  Eyes:     General: No scleral icterus.    Conjunctiva/sclera: Conjunctivae normal.  Neck:     Thyroid: No thyromegaly.  Cardiovascular:     Rate and Rhythm: Normal rate and regular rhythm.     Heart sounds: Normal heart sounds.  Pulmonary:     Effort: Pulmonary effort is normal.     Breath sounds: Normal breath sounds.  Abdominal:     General: Bowel sounds are normal. There is no distension.     Palpations: Abdomen is soft.     Tenderness: There is no abdominal tenderness. There is no guarding.  Musculoskeletal:        General: Normal range of motion.     Cervical back: Normal range of motion and neck supple.  Lymphadenopathy:     Cervical: No cervical adenopathy.  Skin:    General: Skin is warm and dry.     Findings: No rash.  Neurological:     General: No focal deficit present.     Mental Status: She is alert and oriented to person, place, and time.     Cranial Nerves: No cranial nerve deficit.     Coordination: Coordination normal.  Psychiatric:        Mood and Affect: Mood normal.        Behavior:  Behavior normal.     ------------------------------------------------------------------------------------------------------------------------------------------------------------------------------------------------------------------- Assessment and Plan  No problem-specific Assessment & Plan notes found for this encounter.    This visit occurred during the SARS-CoV-2 public health emergency.  Safety protocols were in place, including screening questions prior to the visit, additional usage of staff PPE, and extensive cleaning of exam room while observing appropriate contact time as indicated for disinfecting solutions.

## 2020-01-01 NOTE — Patient Instructions (Addendum)
Great to meet you today!     Preventive Care 40-53 Years Old, Female Preventive care refers to visits with your health care provider and lifestyle choices that can promote health and wellness. This includes:  A yearly physical exam. This may also be called an annual well check.  Regular dental visits and eye exams.  Immunizations.  Screening for certain conditions.  Healthy lifestyle choices, such as eating a healthy diet, getting regular exercise, not using drugs or products that contain nicotine and tobacco, and limiting alcohol use. What can I expect for my preventive care visit? Physical exam Your health care provider will check your:  Height and weight. This may be used to calculate body mass index (BMI), which tells if you are at a healthy weight.  Heart rate and blood pressure.  Skin for abnormal spots. Counseling Your health care provider may ask you questions about your:  Alcohol, tobacco, and drug use.  Emotional well-being.  Home and relationship well-being.  Sexual activity.  Eating habits.  Work and work environment.  Method of birth control.  Menstrual cycle.  Pregnancy history. What immunizations do I need?  Influenza (flu) vaccine  This is recommended every year. Tetanus, diphtheria, and pertussis (Tdap) vaccine  You may need a Td booster every 10 years. Varicella (chickenpox) vaccine  You may need this if you have not been vaccinated. Zoster (shingles) vaccine  You may need this after age 60. Measles, mumps, and rubella (MMR) vaccine  You may need at least one dose of MMR if you were born in 1957 or later. You may also need a second dose. Pneumococcal conjugate (PCV13) vaccine  You may need this if you have certain conditions and were not previously vaccinated. Pneumococcal polysaccharide (PPSV23) vaccine  You may need one or two doses if you smoke cigarettes or if you have certain conditions. Meningococcal conjugate (MenACWY)  vaccine  You may need this if you have certain conditions. Hepatitis A vaccine  You may need this if you have certain conditions or if you travel or work in places where you may be exposed to hepatitis A. Hepatitis B vaccine  You may need this if you have certain conditions or if you travel or work in places where you may be exposed to hepatitis B. Haemophilus influenzae type b (Hib) vaccine  You may need this if you have certain conditions. Human papillomavirus (HPV) vaccine  If recommended by your health care provider, you may need three doses over 6 months. You may receive vaccines as individual doses or as more than one vaccine together in one shot (combination vaccines). Talk with your health care provider about the risks and benefits of combination vaccines. What tests do I need? Blood tests  Lipid and cholesterol levels. These may be checked every 5 years, or more frequently if you are over 50 years old.  Hepatitis C test.  Hepatitis B test. Screening  Lung cancer screening. You may have this screening every year starting at age 55 if you have a 30-pack-year history of smoking and currently smoke or have quit within the past 15 years.  Colorectal cancer screening. All adults should have this screening starting at age 50 and continuing until age 75. Your health care provider may recommend screening at age 45 if you are at increased risk. You will have tests every 1-10 years, depending on your results and the type of screening test.  Diabetes screening. This is done by checking your blood sugar (glucose) after you have not   eaten for a while (fasting). You may have this done every 1-3 years.  Mammogram. This may be done every 1-2 years. Talk with your health care provider about when you should start having regular mammograms. This may depend on whether you have a family history of breast cancer.  BRCA-related cancer screening. This may be done if you have a family history of  breast, ovarian, tubal, or peritoneal cancers.  Pelvic exam and Pap test. This may be done every 3 years starting at age 21. Starting at age 30, this may be done every 5 years if you have a Pap test in combination with an HPV test. Other tests  Sexually transmitted disease (STD) testing.  Bone density scan. This is done to screen for osteoporosis. You may have this scan if you are at high risk for osteoporosis. Follow these instructions at home: Eating and drinking  Eat a diet that includes fresh fruits and vegetables, whole grains, lean protein, and low-fat dairy.  Take vitamin and mineral supplements as recommended by your health care provider.  Do not drink alcohol if: ? Your health care provider tells you not to drink. ? You are pregnant, may be pregnant, or are planning to become pregnant.  If you drink alcohol: ? Limit how much you have to 0-1 drink a day. ? Be aware of how much alcohol is in your drink. In the [place] one drink equals one 12 oz bottle of beer (355 mL), one 5 oz glass of wine (148 mL), or one 1 oz glass of hard liquor (44 mL). Lifestyle  Take daily care of your teeth and gums.  Stay active. Exercise for at least 30 minutes on 5 or more days each week.  Do not use any products that contain nicotine or tobacco, such as cigarettes, e-cigarettes, and chewing tobacco. If you need help quitting, ask your health care provider.  If you are sexually active, practice safe sex. Use a condom or other form of birth control (contraception) in order to prevent pregnancy and STIs (sexually transmitted infections).  If told by your health care provider, take low-dose aspirin daily starting at age 50. What's next?  Visit your health care provider once a year for a well check visit.  Ask your health care provider how often you should have your eyes and teeth checked.  Stay up to date on all vaccines. This information is not intended to replace advice given to you by your  health care provider. Make sure you discuss any questions you have with your health care provider. Document Revised: 07/17/2018 Document Reviewed: 07/17/2018 Elsevier Patient Education  2020 Elsevier Inc.  

## 2020-01-06 ENCOUNTER — Other Ambulatory Visit: Payer: Self-pay

## 2020-01-06 ENCOUNTER — Ambulatory Visit (INDEPENDENT_AMBULATORY_CARE_PROVIDER_SITE_OTHER): Payer: BC Managed Care – PPO

## 2020-01-06 DIAGNOSIS — Z87312 Personal history of (healed) stress fracture: Secondary | ICD-10-CM | POA: Diagnosis not present

## 2020-01-06 DIAGNOSIS — M810 Age-related osteoporosis without current pathological fracture: Secondary | ICD-10-CM | POA: Diagnosis not present

## 2020-01-06 DIAGNOSIS — Z780 Asymptomatic menopausal state: Secondary | ICD-10-CM

## 2020-01-06 DIAGNOSIS — Z8262 Family history of osteoporosis: Secondary | ICD-10-CM

## 2020-01-08 ENCOUNTER — Encounter: Payer: Self-pay | Admitting: Family Medicine

## 2020-01-08 ENCOUNTER — Other Ambulatory Visit: Payer: Self-pay | Admitting: Family Medicine

## 2020-01-08 DIAGNOSIS — M810 Age-related osteoporosis without current pathological fracture: Secondary | ICD-10-CM

## 2020-01-13 DIAGNOSIS — M810 Age-related osteoporosis without current pathological fracture: Secondary | ICD-10-CM | POA: Diagnosis not present

## 2020-01-14 LAB — VITAMIN D 25 HYDROXY (VIT D DEFICIENCY, FRACTURES): Vit D, 25-Hydroxy: 23 ng/mL — ABNORMAL LOW (ref 30–100)

## 2020-01-19 ENCOUNTER — Ambulatory Visit: Payer: BC Managed Care – PPO | Admitting: Family Medicine

## 2020-01-19 ENCOUNTER — Encounter: Payer: Self-pay | Admitting: Family Medicine

## 2020-01-19 ENCOUNTER — Other Ambulatory Visit: Payer: Self-pay

## 2020-01-19 ENCOUNTER — Ambulatory Visit: Payer: Self-pay

## 2020-01-19 VITALS — BP 110/70 | HR 88 | Ht 67.0 in | Wt 172.6 lb

## 2020-01-19 DIAGNOSIS — M79671 Pain in right foot: Secondary | ICD-10-CM

## 2020-01-19 NOTE — Progress Notes (Signed)
I, Wendy Poet, LAT, ATC, am serving as scribe for Dr. Lynne Leader.  Jennifer [name] is a 54 y.o. female who presents to [place] at [facility] today for R dorsal foot pain since Dec 27, 2019 when she walked for 10 miles in newer shoes and started having R foot pain after that.  She rates her pain at a 3/10 and describes her pain as aching and sharp.  She locates her pain to her R dorsal midfoot in line w/ her 2nd and 3rd MTs.  Radiating pain: Yes along the R dorsal foot R foot swelling: Slight Numbness/tingling: No  Aggravating factors: Standing and walking Treatments tried: Ice, heat, elevation, cast shoe, anti-inflammatory cream   Pertinent review of systems: No fevers or chills.  Relevant historical information: History of tricuspid regurgitation.   Exam:  BP 110/70 (BP Location: Left Arm, Patient Position: Sitting, Cuff Size: Normal)   Pulse 88   Ht 5\' 7"  (1.702 m)   Wt 172 lb 9.6 oz (78.3 kg)   LMP 08/02/2014   SpO2 99%   BMI 27.03 kg/m  General: Well Developed, well nourished, and in no acute distress.   MSK: Right foot slightly swollen dorsal midfoot no erythema otherwise normal-appearing. Normal foot and ankle motion. Tender palpation dorsal midfoot at the second tarsometatarsal joint. Pulses cap refill and sensation are intact.   Normal foot and ankle strength.  No pain with resisted toe or ankle dorsiflexion.     Lab and Radiology Results . Diagnostic Limited MSK Ultrasound of: Right dorsal foot Area of tenderness identified at second tarsometatarsal joint.  At this area joint is narrowed with irregular cortex with moderate joint effusion.  Extensor houses longus tendon courses over top of this joint.  Tendon is normal-appearing with no increased hypoechoic fluid.  No pain with activation of this tendon with resisted great toe dorsiflexion. No increased Doppler activity located at the area of effusion and bone cortex change. Impression: DJD  second tarsometatarsal joint  Procedure: Real-time Ultrasound Guided Injection of right foot second tarsometatarsal joint Device: Philips Affiniti 50G Images permanently stored and available for review in the ultrasound unit. Verbal informed consent obtained.  Discussed risks and benefits of procedure. Warned about infection bleeding damage to structures skin hypopigmentation and fat atrophy among others. Patient expresses understanding and agreement Time-out conducted.   Noted no overlying erythema, induration, or other signs of local infection.   Skin prepped in a sterile fashion.   Local anesthesia: Topical Ethyl chloride.   With sterile technique and under real time ultrasound guidance:  20 mg of Kenalog and 0.5 mL of Marcaine.  (Total volume 1 mL) injected easily.   Completed without difficulty   Pain immediately resolved suggesting accurate placement of the medication.   Advised to call if fevers/chills, erythema, induration, drainage, or persistent bleeding.   Images permanently stored and available for review in the ultrasound unit.  Impression: Technically successful ultrasound guided injection.    Assessment and Plan: 54 y.o. female with right foot pain ongoing for approximately 1 month.  Because of pain DJD.  Differential does include stress reaction however this is much less likely.  After discussion plan for injection as above.  Did discuss risk including possibly worsening stress fracture or other bony abnormalities.  However we both think the risk is minimal and injection is warranted at this point. Plan for continued advancement of activity as tolerated.  If not improving patient will notify me and I will arrange for  x-ray to be done in [place].  Likely will follow-up with MRI.  If doing well no need for follow-up however.   Orders Placed This Encounter  Procedures  . [place] LIMITED JOINT SPACE STRUCTURES LOW RIGHT(NO LINKED CHARGES)    Order Specific Question:   Reason  for Exam (SYMPTOM  OR DIAGNOSIS REQUIRED)    Answer:   R foot pain    Order Specific Question:   Preferred imaging location?    Answer:   [place]   No orders of the defined types were placed in this encounter.    Discussed warning signs or symptoms. Please see discharge instructions. Patient expresses understanding.   The above documentation has been reviewed and is accurate and complete Lynne Leader

## 2020-01-19 NOTE — Patient Instructions (Signed)
Thank you for coming in today. Call or go to the ER if you develop a large red swollen joint with extreme pain or oozing puss.  Keep me updated.  The pain may come back in a few hours when the numbing medicine wears off.  The cortisone will start in 1-3 days.  Advance activity as tolerated.  If not improving let me know and I will arrange for xray and likely MRI in [place].

## 2020-03-25 ENCOUNTER — Encounter: Payer: Self-pay | Admitting: Family Medicine

## 2020-03-25 ENCOUNTER — Other Ambulatory Visit: Payer: Self-pay

## 2020-03-25 DIAGNOSIS — E559 Vitamin D deficiency, unspecified: Secondary | ICD-10-CM

## 2020-03-25 DIAGNOSIS — M810 Age-related osteoporosis without current pathological fracture: Secondary | ICD-10-CM

## 2020-04-04 DIAGNOSIS — N62 Hypertrophy of breast: Secondary | ICD-10-CM | POA: Diagnosis not present

## 2020-04-04 DIAGNOSIS — Z9889 Other specified postprocedural states: Secondary | ICD-10-CM | POA: Diagnosis not present

## 2020-05-02 ENCOUNTER — Encounter: Payer: Self-pay | Admitting: Medical-Surgical

## 2020-05-04 DIAGNOSIS — E559 Vitamin D deficiency, unspecified: Secondary | ICD-10-CM | POA: Diagnosis not present

## 2020-05-07 LAB — VITAMIN D 1,25 DIHYDROXY
Vitamin D 1, 25 (OH)2 Total: 43 pg/mL (ref 18–72)
Vitamin D2 1, 25 (OH)2: <8 pg/mL
Vitamin D3 1, 25 (OH)2: 43 pg/mL

## 2020-05-18 ENCOUNTER — Telehealth (INDEPENDENT_AMBULATORY_CARE_PROVIDER_SITE_OTHER): Payer: BC Managed Care – PPO | Admitting: Family Medicine

## 2020-05-18 ENCOUNTER — Encounter: Payer: Self-pay | Admitting: Family Medicine

## 2020-05-18 DIAGNOSIS — M810 Age-related osteoporosis without current pathological fracture: Secondary | ICD-10-CM

## 2020-05-18 DIAGNOSIS — Z887 Allergy status to serum and vaccine status: Secondary | ICD-10-CM

## 2020-05-18 MED ORDER — ALENDRONATE SODIUM 70 MG PO TABS: 70.0000 mg | ORAL_TABLET | ORAL | 3 refills | Status: DC

## 2020-05-18 NOTE — Progress Notes (Signed)
Jennifer [name] - 54 y.o. female MRN [id number]  Date of birth: 11/29/1965   This visit type was conducted due to national recommendations for restrictions regarding the COVID-19 Pandemic (e.g. social distancing).  This format is felt to be most appropriate for this patient at this time.  All issues noted in this document were discussed and addressed.  No physical exam was performed (except for noted visual exam findings with Video Visits).  I discussed the limitations of evaluation and management by telemedicine and the availability of in person appointments. The patient expressed understanding and agreed to proceed.  I connected with@ on 05/18/20 at  1:20 PM EDT by a video enabled telemedicine application and verified that I am speaking with the correct person using two identifiers.  Present at visit: [name], DO [place]   Patient Location: Home [address] [place] [address]   Provider location:   [place]  No chief complaint on file.   HPI  Jennifer [name] is a 54 y.o. female who presents via audio/video conferencing for a telehealth visit today.  She is following up today for osteoporosis.  Previous T score on 2/21 DEXA -3.2 at the spine.  She has never received treatment for osteoporosis.  She has been taking vitamin d  which was recently normal, and is ready to start medication for osteoporosis.    She also has concern about COVID vaccine.  She has not received due to her prior anaphylactic reaction to flu vaccine.  She thinks her employer may require this in the fall and she would like a letter stating her prior reaction to flu vaccine if needed.     ROS:  A comprehensive ROS was completed and negative except as noted per HPI  Past Medical History:  Diagnosis Date  . Fibroids     Past Surgical History:  Procedure Laterality Date  . HEEL SPUR SURGERY Right   . SHOULDER SURGERY Right     Family History  Problem Relation Age of Onset  .  Hyperlipidemia Mother   . Diabetes Father   . Cancer Paternal Grandmother        vulvar cancer    Social History   Socioeconomic History  . Marital status: Married    Spouse name: Not on file  . Number of children: Not on file  . Years of education: Not on file  . Highest education level: Not on file  Occupational History  . Occupation: paralegal  Tobacco Use  . Smoking status: Never Smoker  . Smokeless tobacco: Never Used  Vaping Use  . Vaping Use: Never used  Substance and Sexual Activity  . Alcohol use: No    Alcohol/week: 0.0 standard drinks  . Drug use: No  . Sexual activity: Yes    Partners: Male    Birth control/protection: None  Other Topics Concern  . Not on file  Social History Narrative  . Not on file   Social Determinants of Health   Financial Resource Strain:   . Difficulty of Paying Living Expenses:   Food Insecurity:   . Worried About Charity fundraiser in the Last Year:   . Arboriculturist in the Last Year:   Transportation Needs:   . Film/video editor (Medical):   Marland Kitchen Lack of Transportation (Non-Medical):   Physical Activity:   . Days of Exercise per Week:   . Minutes of Exercise per Session:   Stress:   . Feeling of Stress :  Social Connections:   . Frequency of Communication with Friends and Family:   . Frequency of Social Gatherings with Friends and Family:   . Attends Religious Services:   . Active Member of Clubs or Organizations:   . Attends Archivist Meetings:   Marland Kitchen Marital Status:   Intimate Partner Violence:   . Fear of Current or Ex-Partner:   . Emotionally Abused:   Marland Kitchen Physically Abused:   . Sexually Abused:      Current Outpatient Medications:  .  cholecalciferol (VITAMIN D3) 25 MCG (1000 UT) tablet, Take 1,000 Units by mouth daily., Disp: , Rfl:  .  clobetasol cream (TEMOVATE) 0.05 %, Apply to affected area BID for 1 week then daily for one week, Disp: 30 g, Rfl: 5 .  fluticasone (FLONASE) 50 MCG/ACT nasal  spray, Place into both nostrils daily., Disp: , Rfl:  .  levocetirizine (XYZAL) 5 MG tablet, Take 5 mg every evening by mouth., Disp: , Rfl:  .  alendronate (FOSAMAX) 70 MG tablet, Take 1 tablet (70 mg total) by mouth every 7 (seven) days. Take with a full glass of water on an empty stomach., Disp: 12 tablet, Rfl: 3  EXAM:  VITALS per patient if applicable: Wt 174 lb (78.9 kg)   LMP 08/02/2014   BMI 27.25 kg/m   GENERAL: alert, oriented, appears well and in no acute distress  HEENT: atraumatic, conjunttiva clear, no obvious abnormalities on inspection of external nose and ears  NECK: normal movements of the head and neck  LUNGS: on inspection no signs of respiratory distress, breathing rate appears normal, no obvious gross SOB, gasping or wheezing  CV: no obvious cyanosis  MS: moves all visible extremities without noticeable abnormality  PSYCH/NEURO: pleasant and cooperative, no obvious depression or anxiety, speech and thought processing grossly intact  ASSESSMENT AND PLAN:  Discussed the following assessment and plan:  Osteoporosis Start alendronate 70mg  weekly.  Reviewed side effects and recommendations to take on empty stomach and stay upright after taking.   She will continue vitamin d and calcium supplementation.      History of influenza vaccine allergy Discussed with her that it would be unlikely for her to have reaction to COVID vaccine and that monitoring is done closely after administration.  She remains hesitant to have this done.  I discussed with her that I would be happy to provide letter regarding her prior reaction to flu vaccine if needed.   30 minutes spent including pre visit preparation, review of prior notes and labs, encounter with patient via video visit and same day documentation.    I discussed the assessment and treatment plan with the patient. The patient was provided an opportunity to ask questions and all were answered. The patient agreed with  the plan and demonstrated an understanding of the instructions.   The patient was advised to call back or seek an in-person evaluation if the symptoms worsen or if the condition fails to improve as anticipated.    [name], DO

## 2020-05-18 NOTE — Assessment & Plan Note (Signed)
Start alendronate 70mg  weekly.  Reviewed side effects and recommendations to take on empty stomach and stay upright after taking.   She will continue vitamin d and calcium supplementation.

## 2020-05-18 NOTE — Progress Notes (Signed)
Asking about COVID vaccine.  How that relates to her because she's allergic to the flu shot.  And osteoporosis treatment.

## 2020-05-18 NOTE — Assessment & Plan Note (Signed)
Discussed with her that it would be unlikely for her to have reaction to COVID vaccine and that monitoring is done closely after administration.  She remains hesitant to have this done.  I discussed with her that I would be happy to provide letter regarding her prior reaction to flu vaccine if needed.

## 2020-06-08 DIAGNOSIS — L821 Other seborrheic keratosis: Secondary | ICD-10-CM | POA: Diagnosis not present

## 2020-06-08 DIAGNOSIS — R233 Spontaneous ecchymoses: Secondary | ICD-10-CM | POA: Diagnosis not present

## 2020-06-08 DIAGNOSIS — D225 Melanocytic nevi of trunk: Secondary | ICD-10-CM | POA: Diagnosis not present

## 2020-06-08 DIAGNOSIS — H00014 Hordeolum externum left upper eyelid: Secondary | ICD-10-CM | POA: Diagnosis not present

## 2020-06-08 DIAGNOSIS — L814 Other melanin hyperpigmentation: Secondary | ICD-10-CM | POA: Diagnosis not present

## 2020-06-13 DIAGNOSIS — Z9889 Other specified postprocedural states: Secondary | ICD-10-CM | POA: Diagnosis not present

## 2020-07-06 DIAGNOSIS — H00014 Hordeolum externum left upper eyelid: Secondary | ICD-10-CM | POA: Diagnosis not present

## 2020-07-30 ENCOUNTER — Encounter: Payer: Self-pay | Admitting: Family Medicine

## 2020-08-02 NOTE — Telephone Encounter (Signed)
Patient needs to schedule virtual appointment with Dr. Zigmund Daniel to discuss COVID vaccine.  Thanks so much!!

## 2020-08-02 NOTE — Telephone Encounter (Signed)
Pt scheduled for virtual on Friday

## 2020-08-05 ENCOUNTER — Telehealth (INDEPENDENT_AMBULATORY_CARE_PROVIDER_SITE_OTHER): Payer: BC Managed Care – PPO | Admitting: Family Medicine

## 2020-08-05 ENCOUNTER — Encounter: Payer: Self-pay | Admitting: Family Medicine

## 2020-08-05 DIAGNOSIS — Z7189 Other specified counseling: Secondary | ICD-10-CM

## 2020-08-05 DIAGNOSIS — Z7185 Encounter for immunization safety counseling: Secondary | ICD-10-CM

## 2020-08-07 DIAGNOSIS — Z7185 Encounter for immunization safety counseling: Secondary | ICD-10-CM | POA: Insufficient documentation

## 2020-08-07 NOTE — Assessment & Plan Note (Signed)
Discussed with her that only true medical contraindications to COVID vaccine is anaphylaxis or severe reaction to initial COVID vaccine or one of the vaccine components.  Discussed that there is limited shared ingredients between flu vaccine and mRNA based COVID vaccines.   However, given her significant anxiety and unknown component that caused her previous anaphylactic reaction I did agree to provide letter advising that she not receive vaccine at this time.

## 2020-08-07 NOTE — Progress Notes (Signed)
Jennifer [name] - 54 y.o. female MRN [id number]  Date of birth: 01/02/1966   This visit type was conducted due to national recommendations for restrictions regarding the COVID-19 Pandemic (e.g. social distancing).  This format is felt to be most appropriate for this patient at this time.  All issues noted in this document were discussed and addressed.  No physical exam was performed (except for noted visual exam findings with Video Visits).  I discussed the limitations of evaluation and management by telemedicine and the availability of in person appointments. The patient expressed understanding and agreed to proceed.  I connected with@ on 08/07/20 at 10:10 AM EDT by a video enabled telemedicine application and verified that I am speaking with the correct person using two identifiers.  Present at visit: Jennifer Nutting, DO [place]   Patient Location: Home [address] [place] [address]   Provider location:   [place]  No chief complaint on file.   HPI  Jennifer [name] is a 54 y.o. female who presents via audio/video conferencing for a telehealth visit today.  She would like to discuss receiving COVID vaccine.  She reports significant anxiety related to getting vaccine.  She states that she had a delayed anaphylactic reaction to flu vaccine a few years ago and had to be treated in the hospital.   She followed up with her PCP after that and was told to not get flu vaccine and be very cautious with other vaccines as component that caused her reaction was unknown.  She has not received any vaccines since this reaction due to overwhelming anxiety related to her prior reaction.    ROS:  A comprehensive ROS was completed and negative except as noted per HPI     Past Medical History:  Diagnosis Date  . Fibroids     Past Surgical History:  Procedure Laterality Date  . HEEL SPUR SURGERY Right   . SHOULDER SURGERY Right     Family History  Problem Relation Age of Onset  .  Hyperlipidemia Mother   . Diabetes Father   . Cancer Paternal Grandmother        vulvar cancer    Social History   Socioeconomic History  . Marital status: Married    Spouse name: Not on file  . Number of children: Not on file  . Years of education: Not on file  . Highest education level: Not on file  Occupational History  . Occupation: paralegal  Tobacco Use  . Smoking status: Never Smoker  . Smokeless tobacco: Never Used  Vaping Use  . Vaping Use: Never used  Substance and Sexual Activity  . Alcohol use: No    Alcohol/week: 0.0 standard drinks  . Drug use: No  . Sexual activity: Yes    Partners: Male    Birth control/protection: None  Other Topics Concern  . Not on file  Social History Narrative  . Not on file   Social Determinants of Health   Financial Resource Strain:   . Difficulty of Paying Living Expenses: Not on file  Food Insecurity:   . Worried About Charity fundraiser in the Last Year: Not on file  . Ran Out of Food in the Last Year: Not on file  Transportation Needs:   . Lack of Transportation (Medical): Not on file  . Lack of Transportation (Non-Medical): Not on file  Physical Activity:   . Days of Exercise per Week: Not on file  . Minutes of Exercise  per Session: Not on file  Stress:   . Feeling of Stress : Not on file  Social Connections:   . Frequency of Communication with Friends and Family: Not on file  . Frequency of Social Gatherings with Friends and Family: Not on file  . Attends Religious Services: Not on file  . Active Member of Clubs or Organizations: Not on file  . Attends Archivist Meetings: Not on file  . Marital Status: Not on file  Intimate Partner Violence:   . Fear of Current or Ex-Partner: Not on file  . Emotionally Abused: Not on file  . Physically Abused: Not on file  . Sexually Abused: Not on file     Current Outpatient Medications:  .  alendronate (FOSAMAX) 70 MG tablet, Take 1 tablet (70 mg total) by  mouth every 7 (seven) days. Take with a full glass of water on an empty stomach., Disp: 12 tablet, Rfl: 3 .  cholecalciferol (VITAMIN D3) 25 MCG (1000 UT) tablet, Take 1,000 Units by mouth daily., Disp: , Rfl:  .  clobetasol cream (TEMOVATE) 0.05 %, Apply to affected area BID for 1 week then daily for one week, Disp: 30 g, Rfl: 5 .  fluticasone (FLONASE) 50 MCG/ACT nasal spray, Place into both nostrils daily., Disp: , Rfl:  .  levocetirizine (XYZAL) 5 MG tablet, Take 5 mg every evening by mouth., Disp: , Rfl:   EXAM:  VITALS per patient if applicable: BP 101/75   Pulse 73   Wt 168 lb (76.2 kg)   LMP 08/02/2014   BMI 26.31 kg/m   GENERAL: alert, oriented, appears well and in no acute distress  HEENT: atraumatic, conjunttiva clear, no obvious abnormalities on inspection of external nose and ears  NECK: normal movements of the head and neck  LUNGS: on inspection no signs of respiratory distress, breathing rate appears normal, no obvious gross SOB, gasping or wheezing  CV: no obvious cyanosis  MS: moves all visible extremities without noticeable abnormality  PSYCH/NEURO: pleasant and cooperative, no obvious depression or anxiety, speech and thought processing grossly intact  ASSESSMENT AND PLAN:  Discussed the following assessment and plan:  Vaccine counseling Discussed with her that only true medical contraindications to COVID vaccine is anaphylaxis or severe reaction to initial COVID vaccine or one of the vaccine components.  Discussed that there is limited shared ingredients between flu vaccine and mRNA based COVID vaccines.   However, given her significant anxiety and unknown component that caused her previous anaphylactic reaction I did agree to provide letter advising that she not receive vaccine at this time.      I discussed the assessment and treatment plan with the patient. The patient was provided an opportunity to ask questions and all were answered. The patient agreed  with the plan and demonstrated an understanding of the instructions.   The patient was advised to call back or seek an in-person evaluation if the symptoms worsen or if the condition fails to improve as anticipated.    Jennifer Nutting, DO

## 2020-08-22 DIAGNOSIS — H9041 Sensorineural hearing loss, unilateral, right ear, with unrestricted hearing on the contralateral side: Secondary | ICD-10-CM | POA: Diagnosis not present

## 2020-09-05 DIAGNOSIS — L910 Hypertrophic scar: Secondary | ICD-10-CM | POA: Diagnosis not present

## 2020-09-05 DIAGNOSIS — Z006 Encounter for examination for normal comparison and control in clinical research program: Secondary | ICD-10-CM | POA: Diagnosis not present

## 2020-09-06 ENCOUNTER — Encounter: Payer: Self-pay | Admitting: Family Medicine

## 2020-09-13 ENCOUNTER — Telehealth (INDEPENDENT_AMBULATORY_CARE_PROVIDER_SITE_OTHER): Payer: BC Managed Care – PPO | Admitting: Family Medicine

## 2020-09-13 ENCOUNTER — Encounter: Payer: Self-pay | Admitting: Family Medicine

## 2020-09-13 VITALS — BP 106/69 | HR 69 | Wt 168.0 lb

## 2020-09-13 DIAGNOSIS — E7800 Pure hypercholesterolemia, unspecified: Secondary | ICD-10-CM | POA: Diagnosis not present

## 2020-09-13 DIAGNOSIS — E349 Endocrine disorder, unspecified: Secondary | ICD-10-CM | POA: Insufficient documentation

## 2020-09-13 NOTE — Progress Notes (Signed)
Jennifer VARI - 54 y.o. [name] MRN [id number]  Date of birth: June 09, 1966   This visit type was conducted due to national recommendations for restrictions regarding the COVID-19 Pandemic (e.g. social distancing).  This format is felt to be most appropriate for this patient at this time.  All issues noted in this document were discussed and addressed.  No physical exam was performed (except for noted visual exam findings with Video Visits).  I discussed the limitations of evaluation and management by telemedicine and the availability of in person appointments. The patient expressed understanding and agreed to proceed.  I connected with@ on 09/13/20 at  2:40 PM EDT by a video enabled telemedicine application and verified that I am speaking with the correct person using two identifiers.  Present at visit: [name], DO [place]   Patient Location: [place] [address]   Provider location:   [place]  Patient presents with  . Results    HPI  Jennifer [name] is a 54 y.o. [name] who presents via audio/video conferencing for a telehealth visit today.  She is presenting today to discuss lab results.  She recently had labs completed at [facility] as part of a trial she was participating in.  The trial was using a topical medication to reduce scarring related to her previous breast reduction surgery.  After completion of trial she had labs which showed elevated serum HCG and elevated cholesterol.  She has been post menopausal for several years.  She denies any symptoms or vaginal bleeding.     ROS:  A comprehensive ROS was completed and negative except as noted per HPI  Past Medical History:  Diagnosis Date  . Fibroids     Past Surgical History:  Procedure Laterality Date  . HEEL SPUR SURGERY Right   . SHOULDER SURGERY Right     Family History  Problem Relation Age of Onset  . Hyperlipidemia Mother   . Diabetes Father   . Cancer  Paternal Grandmother        vulvar cancer    Social History   Socioeconomic History  . Marital status: Married    Spouse name: Not on file  . Number of children: Not on file  . Years of education: Not on file  . Highest education level: Not on file  Occupational History  . Occupation: paralegal  Tobacco Use  . Smoking status: Never Smoker  . Smokeless tobacco: Never Used  Vaping Use  . Vaping Use: Never used  Substance and Sexual Activity  . Alcohol use: No    Alcohol/week: 0.0 standard drinks  . Drug use: No  . Sexual activity: Yes    Partners: Male    Birth control/protection: None  Other Topics Concern  . Not on file  Social History Narrative  . Not on file   Social Determinants of Health   Financial Resource Strain:   . Difficulty of Paying Living Expenses: Not on file  Food Insecurity:   . Worried About Charity fundraiser in the Last Year: Not on file  . Ran Out of Food in the Last Year: Not on file  Transportation Needs:   . Lack of Transportation (Medical): Not on file  . Lack of Transportation (Non-Medical): Not on file  Physical Activity:   . Days of Exercise per Week: Not on file  . Minutes of Exercise per Session: Not on file  Stress:   . Feeling of Stress :  Not on file  Social Connections:   . Frequency of Communication with Friends and Family: Not on file  . Frequency of Social Gatherings with Friends and Family: Not on file  . Attends Religious Services: Not on file  . Active Member of Clubs or Organizations: Not on file  . Attends Archivist Meetings: Not on file  . Marital Status: Not on file  Intimate Partner Violence:   . Fear of Current or Ex-Partner: Not on file  . Emotionally Abused: Not on file  . Physically Abused: Not on file  . Sexually Abused: Not on file     Current Outpatient Medications:  .  alendronate (FOSAMAX) 70 MG tablet, Take 1 tablet (70 mg total) by mouth every 7 (seven) days. Take with a full glass of water  on an empty stomach., Disp: 12 tablet, Rfl: 3 .  cholecalciferol (VITAMIN D3) 25 MCG (1000 UT) tablet, Take 1,000 Units by mouth daily., Disp: , Rfl:  .  clobetasol cream (TEMOVATE) 0.05 %, Apply to affected area BID for 1 week then daily for one week, Disp: 30 g, Rfl: 5 .  fluticasone (FLONASE) 50 MCG/ACT nasal spray, Place into both nostrils daily., Disp: , Rfl:  .  levocetirizine (XYZAL) 5 MG tablet, Take 5 mg every evening by mouth., Disp: , Rfl:   EXAM:  VITALS per patient if applicable: BP 790/24   Pulse 69   Wt 168 lb (76.2 kg)   LMP 08/02/2014   BMI 26.31 kg/m   GENERAL: alert, oriented, appears well and in no acute distress  HEENT: atraumatic, conjunttiva clear, no obvious abnormalities on inspection of external nose and ears  NECK: normal movements of the head and neck  LUNGS: on inspection no signs of respiratory distress, breathing rate appears normal, no obvious gross SOB, gasping or wheezing  CV: no obvious cyanosis  MS: moves all visible extremities without noticeable abnormality  PSYCH/NEURO: pleasant and cooperative, no obvious depression or anxiety, speech and thought processing grossly intact  ASSESSMENT AND PLAN:  Discussed the following assessment and plan:  Elevated cholesterol Recheck lipids when fasting.    Elevated serum hCG Very mild elevation at 40mIU/mL.  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  We will repeat to be sure this is stable and check LH/FSH.       I discussed the assessment and treatment plan with the patient. The patient was provided an opportunity to ask questions and all were answered. The patient agreed with the plan and demonstrated an understanding of the instructions.   The patient was advised to call back or seek an in-person evaluation if the symptoms worsen or if the condition fails to improve as anticipated.    [name], DO

## 2020-09-13 NOTE — Assessment & Plan Note (Signed)
Very mild elevation at 86mIU/mL.  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  We will repeat to be sure this is stable and check LH/FSH.

## 2020-09-13 NOTE — Assessment & Plan Note (Signed)
Recheck lipids when fasting.

## 2020-09-13 NOTE — Addendum Note (Signed)
Addended by: Perlie Mayo on: 09/13/2020 03:10 PM   Modules accepted: Level of Service

## 2020-09-14 DIAGNOSIS — E7800 Pure hypercholesterolemia, unspecified: Secondary | ICD-10-CM | POA: Diagnosis not present

## 2020-09-14 DIAGNOSIS — E349 Endocrine disorder, unspecified: Secondary | ICD-10-CM | POA: Diagnosis not present

## 2020-09-15 LAB — HCG, TOTAL, QUANTITATIVE: hCG, Beta Chain, Quant, S: 7 m[IU]/mL — ABNORMAL HIGH

## 2020-09-15 LAB — LIPID PANEL
Cholesterol: 218 mg/dL — ABNORMAL HIGH (ref <200)
HDL: 46 mg/dL — ABNORMAL LOW (ref >=50)
LDL Cholesterol (Calc): 142 mg/dL (calc) — ABNORMAL HIGH
Non-HDL Cholesterol (Calc): 172 mg/dL (calc) — ABNORMAL HIGH (ref <130)
Total CHOL/HDL Ratio: 4.7 (calc) (ref <5.0)
Triglycerides: 165 mg/dL — ABNORMAL HIGH (ref <150)

## 2020-09-15 LAB — FSH/LH
FSH: 75 m[IU]/mL
LH: 29.8 m[IU]/mL

## 2020-09-16 ENCOUNTER — Encounter: Payer: Self-pay | Admitting: Family Medicine

## 2020-09-22 NOTE — Telephone Encounter (Signed)
Looks like this was written already, can you print and put at the front for patient?

## 2020-09-22 NOTE — Telephone Encounter (Signed)
Letter is available at [employer] in [place].

## 2020-11-29 DIAGNOSIS — Z20828 Contact with and (suspected) exposure to other viral communicable diseases: Secondary | ICD-10-CM | POA: Diagnosis not present

## 2020-11-30 DIAGNOSIS — Z20828 Contact with and (suspected) exposure to other viral communicable diseases: Secondary | ICD-10-CM | POA: Diagnosis not present

## 2021-01-17 ENCOUNTER — Encounter: Payer: Self-pay | Admitting: Family Medicine

## 2021-01-20 ENCOUNTER — Encounter: Payer: Self-pay | Admitting: Family Medicine

## 2021-05-18 DIAGNOSIS — L578 Other skin changes due to chronic exposure to nonionizing radiation: Secondary | ICD-10-CM | POA: Diagnosis not present

## 2021-05-18 DIAGNOSIS — D485 Neoplasm of uncertain behavior of skin: Secondary | ICD-10-CM | POA: Diagnosis not present

## 2021-05-18 DIAGNOSIS — C44319 Basal cell carcinoma of skin of other parts of face: Secondary | ICD-10-CM | POA: Diagnosis not present

## 2021-05-18 DIAGNOSIS — D2361 Other benign neoplasm of skin of right upper limb, including shoulder: Secondary | ICD-10-CM | POA: Diagnosis not present

## 2021-05-18 DIAGNOSIS — L821 Other seborrheic keratosis: Secondary | ICD-10-CM | POA: Diagnosis not present

## 2021-05-18 DIAGNOSIS — D225 Melanocytic nevi of trunk: Secondary | ICD-10-CM | POA: Diagnosis not present

## 2021-06-23 DIAGNOSIS — C4401 Basal cell carcinoma of skin of lip: Secondary | ICD-10-CM | POA: Diagnosis not present

## 2021-07-15 ENCOUNTER — Other Ambulatory Visit: Payer: Self-pay | Admitting: Family Medicine

## 2021-08-07 DIAGNOSIS — L905 Scar conditions and fibrosis of skin: Secondary | ICD-10-CM | POA: Diagnosis not present

## 2021-09-12 ENCOUNTER — Encounter: Payer: Self-pay | Admitting: Medical-Surgical

## 2021-10-05 DIAGNOSIS — Z1231 Encounter for screening mammogram for malignant neoplasm of breast: Secondary | ICD-10-CM | POA: Diagnosis not present

## 2021-10-05 LAB — HM MAMMOGRAPHY

## 2021-10-11 ENCOUNTER — Encounter: Payer: Self-pay | Admitting: Family Medicine

## 2021-12-20 DIAGNOSIS — C44321 Squamous cell carcinoma of skin of nose: Secondary | ICD-10-CM | POA: Diagnosis not present

## 2021-12-20 DIAGNOSIS — B351 Tinea unguium: Secondary | ICD-10-CM | POA: Diagnosis not present

## 2022-01-16 DIAGNOSIS — C44321 Squamous cell carcinoma of skin of nose: Secondary | ICD-10-CM | POA: Diagnosis not present

## 2022-02-09 ENCOUNTER — Other Ambulatory Visit: Payer: Self-pay

## 2022-02-09 ENCOUNTER — Encounter: Payer: Self-pay | Admitting: Family Medicine

## 2022-02-09 ENCOUNTER — Ambulatory Visit (INDEPENDENT_AMBULATORY_CARE_PROVIDER_SITE_OTHER): Payer: BC Managed Care – PPO | Admitting: Family Medicine

## 2022-02-09 VITALS — BP 110/69 | HR 65 | Temp 98.3°F | Ht 67.0 in | Wt 176.0 lb

## 2022-02-09 DIAGNOSIS — Z0000 Encounter for general adult medical examination without abnormal findings: Secondary | ICD-10-CM | POA: Diagnosis not present

## 2022-02-09 DIAGNOSIS — E7800 Pure hypercholesterolemia, unspecified: Secondary | ICD-10-CM

## 2022-02-09 DIAGNOSIS — E349 Endocrine disorder, unspecified: Secondary | ICD-10-CM | POA: Diagnosis not present

## 2022-02-09 DIAGNOSIS — M810 Age-related osteoporosis without current pathological fracture: Secondary | ICD-10-CM

## 2022-02-09 DIAGNOSIS — Z23 Encounter for immunization: Secondary | ICD-10-CM | POA: Diagnosis not present

## 2022-02-09 DIAGNOSIS — L290 Pruritus ani: Secondary | ICD-10-CM

## 2022-02-09 MED ORDER — ALENDRONATE SODIUM 70 MG PO TABS: ORAL_TABLET | ORAL | 3 refills | Status: DC

## 2022-02-09 MED ORDER — CLOBETASOL PROPIONATE 0.05 % EX CREA: TOPICAL_CREAM | CUTANEOUS | 5 refills | Status: AC

## 2022-02-09 NOTE — Progress Notes (Signed)
?Jennifer [name] - 56 y.o. female MRN [id number]  Date of birth: 1966-07-22 ? ?Subjective ?No chief complaint on file. ? ? ?HPI ?Jennifer [name] Is a 56 year old female here today for annual exam.  Reports that overall she is doing well at this time.   ? ?She is due for annual lab work.  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  She did have colposcopy with borderline LGSIL commend to repeat this in 1 year.  She has not had any follow-up done since that point.  Mammogram is up-to-date. ? ?She is due for repeat bone density testing.   ? ?She needs an updated letter of accommodation for equipment at work to accompany her standing desk. ? ?She is a non-smoker denies alcohol use. ? ?She does exercise occasionally. ?Diet is pretty healthy. ? ?Review of Systems  ?Constitutional:  Negative for chills, fever, malaise/fatigue and weight loss.  ?HENT:  Negative for congestion, ear pain and sore throat.   ?Eyes:  Negative for blurred vision, double vision and pain.  ?Respiratory:  Negative for cough and shortness of breath.   ?Cardiovascular:  Negative for chest pain and palpitations.  ?Gastrointestinal:  Negative for abdominal pain, blood in stool, constipation, heartburn and nausea.  ?Genitourinary:  Negative for dysuria and urgency.  ?Musculoskeletal:  Negative for joint pain and myalgias.  ?Neurological:  Negative for dizziness and headaches.  ?Endo/Heme/Allergies:  Does not bruise/bleed easily.  ?Psychiatric/Behavioral:  Negative for depression. The patient is not nervous/anxious and does not have insomnia.   ? ?Allergies  ?Allergen Reactions  ? Influenza Vaccines Anaphylaxis  ? Chloraprep One Step [Chlorhexidine Gluconate] Rash  ? Hydrocodone-Acetaminophen   ? ? ?Past Medical History:  ?Diagnosis Date  ? Fibroids   ? ? ?Past Surgical History:  ?Procedure Laterality Date  ? HEEL SPUR SURGERY Right   ? SHOULDER SURGERY Right   ? ? ?Social History  ? ?Socioeconomic History  ? Marital status: Married  ?  Spouse name: Not  on file  ? Number of children: Not on file  ? Years of education: Not on file  ? Highest education level: Not on file  ?Occupational History  ? Occupation: paralegal  ?Tobacco Use  ? Smoking status: Never  ? Smokeless tobacco: Never  ?Vaping Use  ? Vaping Use: Never used  ?Substance and Sexual Activity  ? Alcohol use: No  ?  Alcohol/week: 0.0 standard drinks  ? Drug use: No  ? Sexual activity: Yes  ?  Partners: Male  ?  Birth control/protection: None  ?Other Topics Concern  ? Not on file  ?Social History Narrative  ? Not on file  ? ?Social Determinants of Health  ? ?Financial Resource Strain: Not on file  ?Food Insecurity: Not on file  ?Transportation Needs: Not on file  ?Physical Activity: Not on file  ?Stress: Not on file  ?Social Connections: Not on file  ? ? ?Family History  ?Problem Relation Age of Onset  ? Hyperlipidemia Mother   ? Diabetes Father   ? Cancer Paternal Grandmother   ?     vulvar cancer  ? ? ?Health Maintenance  ?Topic Date Due  ? COVID-19 Vaccine (1) Never done  ? Hepatitis C Screening  Never done  ? Zoster Vaccines- Shingrix (1 of 2) Never done  ? COLONOSCOPY (Pts 45-67yr Insurance coverage will need to be confirmed)  11/07/2020  ? PAP SMEAR-Modifier  10/20/2021  ? TETANUS/TDAP  03/08/2022  ? MAMMOGRAM  10/06/2023  ? HIV  Screening  Completed  ? HPV VACCINES  Aged Out  ? ? ? ?----------------------------------------------------------------------------------------------------------------------------------------------------------------------------------------------------------------- ?Physical Exam ?BP 110/69 (BP Location: Left Arm, Patient Position: Sitting, Cuff Size: Small)   Pulse 65   Temp 98.3 ?F (36.8 ?C) (Oral)   Ht '5\' 7"'$  (1.702 m)   Wt 176 lb (79.8 kg)   LMP 08/02/2014   SpO2 99%   BMI 27.57 kg/m?  ? ?Physical Exam ?Constitutional:   ?   General: She is not in acute distress. ?HENT:  ?   Head: Normocephalic and atraumatic.  ?   Right Ear: Tympanic membrane and ear canal normal.  ?    Left Ear: Tympanic membrane and ear canal normal.  ?   Nose: Nose normal.  ?Eyes:  ?   General: No scleral icterus. ?   Conjunctiva/sclera: Conjunctivae normal.  ?Neck:  ?   Thyroid: No thyromegaly.  ?Cardiovascular:  ?   Rate and Rhythm: Normal rate and regular rhythm.  ?   Heart sounds: Normal heart sounds.  ?Pulmonary:  ?   Effort: Pulmonary effort is normal.  ?   Breath sounds: Normal breath sounds.  ?Abdominal:  ?   General: Bowel sounds are normal. There is no distension.  ?   Palpations: Abdomen is soft.  ?   Tenderness: There is no abdominal tenderness. There is no guarding.  ?Musculoskeletal:     ?   General: Normal range of motion.  ?   Cervical back: Normal range of motion and neck supple.  ?Lymphadenopathy:  ?   Cervical: No cervical adenopathy.  ?Skin: ?   General: Skin is warm and dry.  ?   Findings: No rash.  ?Neurological:  ?   General: No focal deficit present.  ?   Mental Status: She is alert and oriented to person, place, and time.  ?   Cranial Nerves: No cranial nerve deficit.  ?   Coordination: Coordination normal.  ?Psychiatric:     ?   Mood and Affect: Mood normal.     ?   Behavior: Behavior normal.  ? ? ?------------------------------------------------------------------------------------------------------------------------------------------------------------------------------------------------------------------- ?Assessment and Plan ? ?Well adult exam ?Well adult ?Orders Placed This Encounter  ?Procedures  ? DG Bone Density  ?  Standing Status:   Future  ?  Standing Expiration Date:   02/10/2023  ?  Order Specific Question:   Reason for Exam (SYMPTOM  OR DIAGNOSIS REQUIRED)  ?  Answer:   Osteoporosis  ?  Order Specific Question:   Is the patient pregnant?  ?  Answer:   No  ?  Order Specific Question:   Preferred imaging location?  ?  [facility]  ? Tdap vaccine greater than or equal to 7yo IM  ? COMPLETE METABOLIC PANEL WITH GFR  ? CBC with Differential  ? Lipid Panel  w/reflex Direct LDL  ? TSH  ? Vitamin D (25 hydroxy)  ? hCG, Total, Quantitative  ?Screenings: Per lab orders.  Recommend that she contact GYN ASAP and schedule repeat Pap as she is overdue for this with history of abnormal Pap.  She is also reminded to schedule colonoscopy.  Repeat DEXA ordered. ?Immunizations: Tdap given today.  She declined shingles vaccine ?Anticipatory guidance/risk factor reduction: Recommendations per AVS. ? ? ?No orders of the defined types were placed in this encounter. ? ? ?No follow-ups on file. ? ? ? ?This visit occurred during the SARS-CoV-2 public health emergency.  Safety protocols were in place, including screening questions prior to the visit,  additional usage of staff PPE, and extensive cleaning of exam room while observing appropriate contact time as indicated for disinfecting solutions.  ? ?

## 2022-02-09 NOTE — Patient Instructions (Addendum)
TO DO:   ?Schedule with GYN for updated PAP-Your last pap was abnormal.  ?Schedule colonoscopy.  ?Bone density test has been ordered-You will be contacted to schedule.  ? ?You may try ashwaganda for stress.  ?Valerian root may help some as well if you have sleep difficulty.  ? ?Managing Anxiety, Adult ?After being diagnosed with anxiety, you may be relieved to know why you have felt or behaved a certain way. You may also feel overwhelmed about the treatment ahead and what it will mean for your life. With care and support, you can manage this condition. ?How to manage lifestyle changes ?Managing stress and anxiety ?Stress is your body's reaction to life changes and events, both good and bad. Most stress will last just a few hours, but stress can be ongoing and can lead to more than just stress. Although stress can play a major role in anxiety, it is not the same as anxiety. Stress is usually caused by something external, such as a deadline, test, or competition. Stress normally passes after the triggering event has ended.  ?Anxiety is caused by something internal, such as imagining a terrible outcome or worrying that something will go wrong that will devastate you. Anxiety often does not go away even after the triggering event is over, and it can become long-term (chronic) worry. It is important to understand the differences between stress and anxiety and to manage your stress effectively so that it does not lead to an anxious response. ?Talk with your health care provider or a counselor to learn more about reducing anxiety and stress. He or she may suggest tension reduction techniques, such as: ?Music therapy. Spend time creating or listening to music that you enjoy and that inspires you. ?Mindfulness-based meditation. Practice being aware of your normal breaths while not trying to control your breathing. It can be done while sitting or walking. ?Centering prayer. This involves focusing on a word, phrase, or sacred  image that means something to you and brings you peace. ?Deep breathing. To do this, expand your stomach and inhale slowly through your nose. Hold your breath for 3-5 seconds. Then exhale slowly, letting your stomach muscles relax. ?Self-talk. Learn to notice and identify thought patterns that lead to anxiety reactions and change those patterns to thoughts that feel peaceful. ?Muscle relaxation. Taking time to tense muscles and then relax them. ?Choose a tension reduction technique that fits your lifestyle and personality. These techniques take time and practice. Set aside 5-15 minutes a day to do them. Therapists can offer counseling and training in these techniques. The training to help with anxiety may be covered by some insurance plans. ?Other things you can do to manage stress and anxiety include: ?Keeping a stress diary. This can help you learn what triggers your reaction and then learn ways to manage your response. ?Thinking about how you react to certain situations. You may not be able to control everything, but you can control your response. ?Making time for activities that help you relax and not feeling guilty about spending your time in this way. ?Doing visual imagery. This involves imagining or creating mental pictures to help you relax. ?Practicing yoga. Through yoga poses, you can lower tension and promote relaxation. ? ?Medicines ?Medicines can help ease symptoms. Medicines for anxiety include: ?Antidepressant medicines. These are usually prescribed for long-term daily control. ?Anti-anxiety medicines. These may be added in severe cases, especially when panic attacks occur. ?Medicines will be prescribed by a health care provider. When used together, medicines,  psychotherapy, and tension reduction techniques may be the most effective treatment. ?Relationships ?Relationships can play a big part in helping you recover. Try to spend more time connecting with trusted friends and family members. ?Consider  going to couples counseling if you have a partner, taking family education classes, or going to family therapy. ?Therapy can help you and others better understand your condition. ?How to recognize changes in your anxiety ?Everyone responds differently to treatment for anxiety. Recovery from anxiety happens when symptoms decrease and stop interfering with your daily activities at home or work. This may mean that you will start to: ?Have better concentration and focus. Worry will interfere less in your daily thinking. ?Sleep better. ?Be less irritable. ?Have more energy. ?Have improved memory. ?It is also important to recognize when your condition is getting worse. Contact your health care provider if your symptoms interfere with home or work and you feel like your condition is not improving. ?Follow these instructions at home: ?Activity ?Exercise. Adults should do the following: ?Exercise for at least 150 minutes each week. The exercise should increase your heart rate and make you sweat (moderate-intensity exercise). ?Strengthening exercises at least twice a week. ?Get the right amount and quality of sleep. Most adults need 7-9 hours of sleep each night. ?Lifestyle ? ?Eat a healthy diet that includes plenty of vegetables, fruits, whole grains, low-fat dairy products, and lean protein. ?Do not eat a lot of foods that are high in fats, added sugars, or salt (sodium). ?Make choices that simplify your life. ?Do not use any products that contain nicotine or tobacco. These products include cigarettes, chewing tobacco, and vaping devices, such as e-cigarettes. If you need help quitting, ask your health care provider. ?Avoid caffeine, alcohol, and certain over-the-counter cold medicines. These may make you feel worse. Ask your pharmacist which medicines to avoid. ?General instructions ?Take over-the-counter and prescription medicines only as told by your health care provider. ?Keep all follow-up visits. This is  important. ?Where to find support ?You can get help and support from these sources: ?Self-help groups. ?Online and [employer]. ?A trusted spiritual leader. ?Couples counseling. ?Family education classes. ?Family therapy. ?Where to find more information ?You may find that joining a support group helps you deal with your anxiety. The following sources can help you locate counselors or support groups near you: ?[place]: www.mentalhealth[place]ica.net ?Anxiety and Depression Association of America (ADAA): https://www.clark.net/ ?National Alliance on Mental Illness (NAMI): www.nami.org ?Contact a health care provider if: ?You have a hard time staying focused or finishing daily tasks. ?You spend many hours a day feeling worried about everyday life. ?You become exhausted by worry. ?You start to have headaches or frequently feel tense. ?You develop chronic nausea or diarrhea. ?Get help right away if: ?You have a racing heart and shortness of breath. ?You have thoughts of hurting yourself or others. ?If you ever feel like you may hurt yourself or others, or have thoughts about taking your own life, get help right away. Go to your nearest emergency department or: ?Call your local emergency services (911 in the [place] ?Call a suicide crisis helpline, such as the [place] at 713-493-9361 or 988 in the [place]. This is open 24 hours a day in the [place] ?Text the Crisis Text Line at 6513607682 (in the [place].). ?Summary ?Taking steps to learn and use tension reduction techniques can help calm you and help prevent triggering an anxiety reaction. ?When used together, medicines, psychotherapy, and tension reduction techniques may be the  most effective treatment. ?Family, friends, and partners can play a big part in supporting you. ?This information is not intended to replace advice given to you by your health care provider. Make sure you discuss any questions you have with your health care  provider. ?Document Revised: 05/31/2021 Document Reviewed: 02/26/2021 ?Elsevier Patient Education ? [place]. ? ?Preventive Care 33-61 Years Old, Female ?Preventive care refers to lifestyle choices and visits with you

## 2022-02-10 LAB — COMPLETE METABOLIC PANEL WITH GFR
AG Ratio: 1.9 (calc) (ref 1.0–2.5)
ALT: 12 U/L (ref 6–29)
AST: 17 U/L (ref 10–35)
Albumin: 4.5 g/dL (ref 3.6–5.1)
Alkaline phosphatase (APISO): 78 U/L (ref 37–153)
BUN: 19 mg/dL (ref 7–25)
CO2: 29 mmol/L (ref 20–32)
Calcium: 9.1 mg/dL (ref 8.6–10.4)
Chloride: 104 mmol/L (ref 98–110)
Creat: 0.87 mg/dL (ref 0.50–1.03)
Globulin: 2.4 g/dL (calc) (ref 1.9–3.7)
Glucose, Bld: 95 mg/dL (ref 65–99)
Potassium: 4.1 mmol/L (ref 3.5–5.3)
Sodium: 140 mmol/L (ref 135–146)
Total Bilirubin: 0.5 mg/dL (ref 0.2–1.2)
Total Protein: 6.9 g/dL (ref 6.1–8.1)
eGFR: 79 mL/min/{1.73_m2} (ref >=60)

## 2022-02-10 LAB — CBC WITH DIFFERENTIAL/PLATELET
Absolute Monocytes: 437 cells/uL (ref 200–950)
Basophils Absolute: 49 cells/uL (ref 0–200)
Basophils Relative: 0.9 %
Eosinophils Absolute: 221 cells/uL (ref 15–500)
Eosinophils Relative: 4.1 %
HCT: 37.2 % (ref 35.0–45.0)
Hemoglobin: 12.2 g/dL (ref 11.7–15.5)
Lymphs Abs: 1744 cells/uL (ref 850–3900)
MCH: 29.3 pg (ref 27.0–33.0)
MCHC: 32.8 g/dL (ref 32.0–36.0)
MCV: 89.4 fL (ref 80.0–100.0)
MPV: 10 fL (ref 7.5–12.5)
Monocytes Relative: 8.1 %
Neutro Abs: 2948 cells/uL (ref 1500–7800)
Neutrophils Relative %: 54.6 %
Platelets: 276 10*3/uL (ref 140–400)
RBC: 4.16 10*6/uL (ref 3.80–5.10)
RDW: 13.8 % (ref 11.0–15.0)
Total Lymphocyte: 32.3 %
WBC: 5.4 10*3/uL (ref 3.8–10.8)

## 2022-02-10 LAB — TSH: TSH: 2.44 mIU/L

## 2022-02-10 LAB — VITAMIN D 25 HYDROXY (VIT D DEFICIENCY, FRACTURES): Vit D, 25-Hydroxy: 60 ng/mL (ref 30–100)

## 2022-02-10 LAB — HCG, TOTAL, QUANTITATIVE: hCG, Beta Chain, Quant, S: 7 m[IU]/mL — ABNORMAL HIGH

## 2022-02-10 LAB — LIPID PANEL W/REFLEX DIRECT LDL
Cholesterol: 224 mg/dL — ABNORMAL HIGH (ref <200)
HDL: 47 mg/dL — ABNORMAL LOW (ref >=50)
LDL Cholesterol (Calc): 154 mg/dL (calc) — ABNORMAL HIGH
Non-HDL Cholesterol (Calc): 177 mg/dL (calc) — ABNORMAL HIGH (ref <130)
Total CHOL/HDL Ratio: 4.8 (calc) (ref <5.0)
Triglycerides: 113 mg/dL (ref <150)

## 2022-02-11 NOTE — Assessment & Plan Note (Signed)
Well adult ?Orders Placed This Encounter  ?Procedures  ?? DG Bone Density  ?  Standing Status:   Future  ?  Standing Expiration Date:   02/10/2023  ?  Order Specific Question:   Reason for Exam (SYMPTOM  OR DIAGNOSIS REQUIRED)  ?  Answer:   Osteoporosis  ?  Order Specific Question:   Is the patient pregnant?  ?  Answer:   No  ?  Order Specific Question:   Preferred imaging location?  ?  [facility]   [facility]  ?? Tdap vaccine greater than or equal to 55yo IM  ?? COMPLETE METABOLIC PANEL WITH GFR  ?? CBC with Differential  ?? Lipid Panel w/reflex Direct LDL  ?? TSH  ?? Vitamin D (25 hydroxy)  ?? hCG, Total, Quantitative  ?Screenings: Per lab orders.  Recommend that she contact GYN ASAP and schedule repeat Pap as she is overdue for this with history of abnormal Pap.  She is also reminded to schedule colonoscopy.  Repeat DEXA ordered. ?Immunizations: Tdap given today.  She declined shingles vaccine ?Anticipatory guidance/risk factor reduction: Recommendations per AVS. ?

## 2022-02-28 ENCOUNTER — Ambulatory Visit (INDEPENDENT_AMBULATORY_CARE_PROVIDER_SITE_OTHER): Payer: BC Managed Care – PPO

## 2022-02-28 DIAGNOSIS — M810 Age-related osteoporosis without current pathological fracture: Secondary | ICD-10-CM

## 2022-02-28 DIAGNOSIS — Z780 Asymptomatic menopausal state: Secondary | ICD-10-CM | POA: Diagnosis not present

## 2022-02-28 DIAGNOSIS — Z0000 Encounter for general adult medical examination without abnormal findings: Secondary | ICD-10-CM

## 2022-03-05 ENCOUNTER — Ambulatory Visit (INDEPENDENT_AMBULATORY_CARE_PROVIDER_SITE_OTHER): Payer: BC Managed Care – PPO | Admitting: Obstetrics & Gynecology

## 2022-03-05 ENCOUNTER — Other Ambulatory Visit (HOSPITAL_COMMUNITY)
Admission: RE | Admit: 2022-03-05 | Discharge: 2022-03-05 | Disposition: A | Discharge status: Home / Self Care | Payer: BC Managed Care – PPO | Source: Ambulatory Visit | Attending: Obstetrics & Gynecology | Admitting: Obstetrics & Gynecology

## 2022-03-05 ENCOUNTER — Encounter: Payer: Self-pay | Admitting: Obstetrics & Gynecology

## 2022-03-05 VITALS — BP 110/73 | HR 64 | Ht 65.5 in | Wt 175.0 lb

## 2022-03-05 DIAGNOSIS — Z01419 Encounter for gynecological examination (general) (routine) without abnormal findings: Secondary | ICD-10-CM | POA: Insufficient documentation

## 2022-03-05 NOTE — Progress Notes (Signed)
Last Mammogram: 10/05/21- normal ?Last Pap Smear:  10/30/18- abnormal ?Last Colon Screening;  11 years ago ?Seat Belts:   Yes ?Sun Screen:   No ?Dental Check Up:  Yes ?Brush & Floss:  Yes  ?

## 2022-03-05 NOTE — Progress Notes (Signed)
Subjective:  ?  ? Jennifer [name] is a 56 y.o. female here for a routine exam.  Current complaints: none. ?  ?Gynecologic History ?Patient's last menstrual period was 08/02/2014. ?Contraception: post menopausal status ?Last Pap: 2019. 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 Results were: normal ? ?Obstetric History ?OB History  ?Gravida Para Term Preterm AB Living  ?'1 1 1     1  '$ ?SAB IAB Ectopic Multiple Live Births  ?           ?  ?# Outcome Date GA Lbr Len/2nd Weight Sex Delivery Anes PTL Lv  ?1 Term           ?  ?Obstetric Comments  ?Has an adopted daughter  ? ? ? ?The following portions of the patient's history were reviewed and updated as appropriate: allergies, current medications, past family history, past medical history, past social history, past surgical history, and problem list. ? ?Review of Systems ?Pertinent items noted in HPI and remainder of comprehensive ROS otherwise negative.  ?  ?Objective:  ? ?  ?Vitals:  ? 03/05/22 1503  ?Weight: 175 lb (79.4 kg)  ?Height: 5' 5.5" (1.664 m)  ? ?Vitals:  ? 03/05/22 1503  ?BP: 110/73  ?Pulse: 64  ?Weight: 175 lb (79.4 kg)  ?Height: 5' 5.5" (1.664 m)  ? ? ?Vitals:  WNL ?General appearance: alert, cooperative and no distress ? ?HEENT: Normocephalic, without obvious abnormality, atraumatic ?Eyes: negative ?Throat: lips, mucosa, and tongue normal; teeth and gums normal  ?Respiratory: Clear to auscultation bilaterally  ?CV: Regular rate and rhythm  ?Breasts:  Normal appearance, no masses or tenderness, no nipple retraction or dimpling  ?GI: Soft, non-tender; bowel sounds normal; no masses,  no organomegaly  ?GU: External Genitalia:  Tanner V, no lesion ?Urethra:  No prolapse   ?Vagina: Pink, normal rugae, no blood or discharge  ?Cervix: No CMT, no lesion  ?Uterus:  Normal size and contour, non tender  ?Adnexa: Normal, no masses, non tender  ?Musculoskeletal: No edema, redness or tenderness in the calves or thighs  ?Skin:  No lesions or rash  ?Lymphatic: Axillary adenopathy: none     ?Psychiatric: Normal mood and behavior  ? ? ?   ? ?Assessment:  ? ? Healthy female exam.  ?  ?Plan:  ? ? 1.  Pap with cotesting ?2.  Yearly mammograms ?3.  Colon cancer screening due--she will schedule with [facility] who did her last colonoscopy.  ? ?

## 2022-03-08 ENCOUNTER — Encounter: Payer: Self-pay | Admitting: Family Medicine

## 2022-03-08 LAB — CYTOLOGY - PAP
Comment: NEGATIVE
Diagnosis: UNDETERMINED — AB
High risk HPV: NEGATIVE

## 2022-03-09 MED ORDER — ROSUVASTATIN CALCIUM 10 MG PO TABS: 10.0000 mg | ORAL_TABLET | Freq: Every day | ORAL | 3 refills | Status: DC

## 2022-05-16 DIAGNOSIS — L570 Actinic keratosis: Secondary | ICD-10-CM | POA: Diagnosis not present

## 2022-05-16 DIAGNOSIS — C44321 Squamous cell carcinoma of skin of nose: Secondary | ICD-10-CM | POA: Diagnosis not present

## 2022-05-16 DIAGNOSIS — L578 Other skin changes due to chronic exposure to nonionizing radiation: Secondary | ICD-10-CM | POA: Diagnosis not present

## 2022-05-16 DIAGNOSIS — L728 Other follicular cysts of the skin and subcutaneous tissue: Secondary | ICD-10-CM | POA: Diagnosis not present

## 2022-08-06 DIAGNOSIS — L820 Inflamed seborrheic keratosis: Secondary | ICD-10-CM | POA: Diagnosis not present

## 2022-08-06 DIAGNOSIS — L300 Nummular dermatitis: Secondary | ICD-10-CM | POA: Diagnosis not present

## 2022-09-04 ENCOUNTER — Ambulatory Visit: Payer: BC Managed Care – PPO

## 2022-09-04 ENCOUNTER — Ambulatory Visit: Payer: BC Managed Care – PPO | Admitting: Family Medicine

## 2022-09-04 ENCOUNTER — Ambulatory Visit: Payer: Self-pay

## 2022-09-04 VITALS — BP 104/70 | HR 74 | Ht 65.5 in | Wt 178.0 lb

## 2022-09-04 DIAGNOSIS — M79671 Pain in right foot: Secondary | ICD-10-CM

## 2022-09-04 NOTE — Progress Notes (Unsigned)
I, Peterson Lombard, LAT, ATC acting as a scribe for Lynne Leader, MD.  Subjective:    CC: R foot pain  HPI: Pt is a 56 y/o female c/o R foot pain. Pt was last seen by Dr. Georgina Snell on 01/19/20 and was given a R 2nd MT joint steroid injection and was advised to advance activity as tolerated. Today, pt reports R foot pain returning about a month ago. Patient locates pain to the dorsum of the R foot. This pain is similar to how it was in 2021. She continues using good arch support.   R foot swelling: yes   Pertinent review of Systems: No fever or chills  Relevant historical information: Osteoporosis   Objective:    Vitals:   09/04/22 1429  BP: 104/70  Pulse: 74  SpO2: 98%   General: Well Developed, well nourished, and in no acute distress.   MSK: Right foot: Slight swelling and bossing at second tarsometatarsal joint area.  Tender to palpation.  Normal foot motion.  Pulses cap refill and sensation are intact distally.  Strength is intact.  Lab and Radiology Results  Procedure: Real-time Ultrasound Guided Injection of right second tarsometatarsal joint Device: Philips Affiniti 50G Images permanently stored and available for review in PACS Verbal informed consent obtained.  Discussed risks and benefits of procedure. Warned about infection, bleeding, hyperglycemia damage to structures among others. Patient expresses understanding and agreement Time-out conducted.   Noted no overlying erythema, induration, or other signs of local infection.   Skin prepped in a sterile fashion.   Local anesthesia: Topical Ethyl chloride.   With sterile technique and under real time ultrasound guidance: 40 mg of Kenalog and 1 mL of lidocaine injected into second tarsometatarsal joint. Fluid seen entering the joint capsule.   Completed without difficulty   Pain immediately resolved suggesting accurate placement of the medication.   Advised to call if fevers/chills, erythema, induration, drainage, or  persistent bleeding.   Images permanently stored and available for review in the ultrasound unit.  Impression: Technically successful ultrasound guided injection.     Impression and Recommendations:    Assessment and Plan: 56 y.o. 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  Second tarsometatarsal joint was injected today.  Continue good arch support.  Check back as needed.  PDMP not reviewed this encounter. Orders Placed This Encounter  Procedures   DG Foot Complete Right    Standing Status:   Future    Number of Occurrences:   1    Standing Expiration Date:   09/05/2023    Order Specific Question:   Reason for Exam (SYMPTOM  OR DIAGNOSIS REQUIRED)    Answer:   Right foot pain    Order Specific Question:   Preferred imaging location?    Answer:   [facility]    Order Specific Question:   Is patient pregnant?    Answer:   No   [place] LIMITED JOINT SPACE STRUCTURES LOW RIGHT(NO LINKED CHARGES)    Order Specific Question:   Reason for Exam (SYMPTOM  OR DIAGNOSIS REQUIRED)    Answer:   Right foot pain    Order Specific Question:   Preferred imaging location?    Answer:   [facility]   No orders of the defined types were placed in this encounter.   Discussed warning signs or symptoms. Please see discharge instructions. Patient expresses understanding.   The above documentation has been reviewed and is accurate and complete Ellard Artis  Georgina Snell, M.D.

## 2022-09-04 NOTE — Patient Instructions (Addendum)
Thank you for coming in today.   Please get an Xray today before you leave   Call or go to the ER if you develop a large red swollen joint with extreme pain or oozing puss.    Let me know if you have a problem.   Recheck as needed.   Good arch support.   Please use Voltaren gel (Generic Diclofenac Gel) up to 4x daily for pain as needed.  This is available over-the-counter as both the name brand Voltaren gel and the generic diclofenac gel.

## 2022-09-26 DIAGNOSIS — Z1231 Encounter for screening mammogram for malignant neoplasm of breast: Secondary | ICD-10-CM | POA: Diagnosis not present

## 2022-09-26 LAB — HM MAMMOGRAPHY

## 2022-10-04 ENCOUNTER — Encounter: Payer: Self-pay | Admitting: Family Medicine

## 2022-11-21 DIAGNOSIS — C44321 Squamous cell carcinoma of skin of nose: Secondary | ICD-10-CM | POA: Diagnosis not present

## 2022-11-21 DIAGNOSIS — B351 Tinea unguium: Secondary | ICD-10-CM | POA: Diagnosis not present

## 2022-11-21 DIAGNOSIS — L821 Other seborrheic keratosis: Secondary | ICD-10-CM | POA: Diagnosis not present

## 2022-11-21 DIAGNOSIS — L820 Inflamed seborrheic keratosis: Secondary | ICD-10-CM | POA: Diagnosis not present

## 2022-11-21 DIAGNOSIS — L578 Other skin changes due to chronic exposure to nonionizing radiation: Secondary | ICD-10-CM | POA: Diagnosis not present

## 2023-02-15 ENCOUNTER — Encounter: Payer: BC Managed Care – PPO | Admitting: Family Medicine

## 2023-02-21 ENCOUNTER — Ambulatory Visit (INDEPENDENT_AMBULATORY_CARE_PROVIDER_SITE_OTHER): Payer: BC Managed Care – PPO | Admitting: Family Medicine

## 2023-02-21 ENCOUNTER — Encounter: Payer: Self-pay | Admitting: Family Medicine

## 2023-02-21 VITALS — BP 99/63 | HR 56 | Ht 65.5 in | Wt 178.0 lb

## 2023-02-21 DIAGNOSIS — C4490 Unspecified malignant neoplasm of skin, unspecified: Secondary | ICD-10-CM | POA: Insufficient documentation

## 2023-02-21 DIAGNOSIS — Z1211 Encounter for screening for malignant neoplasm of colon: Secondary | ICD-10-CM

## 2023-02-21 DIAGNOSIS — Z0000 Encounter for general adult medical examination without abnormal findings: Secondary | ICD-10-CM | POA: Diagnosis not present

## 2023-02-21 DIAGNOSIS — E7800 Pure hypercholesterolemia, unspecified: Secondary | ICD-10-CM | POA: Diagnosis not present

## 2023-02-21 DIAGNOSIS — M810 Age-related osteoporosis without current pathological fracture: Secondary | ICD-10-CM

## 2023-02-21 NOTE — Patient Instructions (Signed)
Preventive Care 40-56 Years Old, Female Preventive care refers to lifestyle choices and visits with your health care provider that can promote health and wellness. Preventive care visits are also called wellness exams. What can I expect for my preventive care visit? Counseling Your health care provider may ask you questions about your: Medical history, including: Past medical problems. Family medical history. Pregnancy history. Current health, including: Menstrual cycle. Method of birth control. Emotional well-being. Home life and relationship well-being. Sexual activity and sexual health. Lifestyle, including: Alcohol, nicotine or tobacco, and drug use. Access to firearms. Diet, exercise, and sleep habits. Work and work environment. Sunscreen use. Safety issues such as seatbelt and bike helmet use. Physical exam Your health care provider will check your: Height and weight. These may be used to calculate your BMI (body mass index). BMI is a measurement that tells if you are at a healthy weight. Waist circumference. This measures the distance around your waistline. This measurement also tells if you are at a healthy weight and may help predict your risk of certain diseases, such as type 2 diabetes and high blood pressure. Heart rate and blood pressure. Body temperature. Skin for abnormal spots. What immunizations do I need?  Vaccines are usually given at various ages, according to a schedule. Your health care provider will recommend vaccines for you based on your age, medical history, and lifestyle or other factors, such as travel or where you work. What tests do I need? Screening Your health care provider may recommend screening tests for certain conditions. This may include: Lipid and cholesterol levels. Diabetes screening. This is done by checking your blood sugar (glucose) after you have not eaten for a while (fasting). Pelvic exam and Pap test. Hepatitis B test. Hepatitis C  test. HIV (human immunodeficiency virus) test. STI (sexually transmitted infection) testing, if you are at risk. Lung cancer screening. Colorectal cancer screening. Mammogram. Talk with your health care provider about when you should start having regular mammograms. This may depend on whether you have a family history of breast cancer. BRCA-related cancer screening. This may be done if you have a family history of breast, ovarian, tubal, or peritoneal cancers. Bone density scan. This is done to screen for osteoporosis. Talk with your health care provider about your test results, treatment options, and if necessary, the need for more tests. Follow these instructions at home: Eating and drinking  Eat a diet that includes fresh fruits and vegetables, whole grains, lean protein, and low-fat dairy products. Take vitamin and mineral supplements as recommended by your health care provider. Do not drink alcohol if: Your health care provider tells you not to drink. You are pregnant, may be pregnant, or are planning to become pregnant. If you drink alcohol: Limit how much you have to 0-1 drink a day. Know how much alcohol is in your drink. In the [place] one drink equals one 12 oz bottle of beer (355 mL), one 5 oz glass of wine (148 mL), or one 1 oz glass of hard liquor (44 mL). Lifestyle Brush your teeth every morning and night with fluoride toothpaste. Floss one time each day. Exercise for at least 30 minutes 5 or more days each week. Do not use any products that contain nicotine or tobacco. These products include cigarettes, chewing tobacco, and vaping devices, such as e-cigarettes. If you need help quitting, ask your health care provider. Do not use drugs. If you are sexually active, practice safe sex. Use a condom or other form of protection to   prevent STIs. If you do not wish to become pregnant, use a form of birth control. If you plan to become pregnant, see your health care provider for a  prepregnancy visit. Take aspirin only as told by your health care provider. Make sure that you understand how much to take and what form to take. Work with your health care provider to find out whether it is safe and beneficial for you to take aspirin daily. Find healthy ways to manage stress, such as: Meditation, yoga, or listening to music. Journaling. Talking to a trusted person. Spending time with friends and family. Minimize exposure to UV radiation to reduce your risk of skin cancer. Safety Always wear your seat belt while driving or riding in a vehicle. Do not drive: If you have been drinking alcohol. Do not ride with someone who has been drinking. When you are tired or distracted. While texting. If you have been using any mind-altering substances or drugs. Wear a helmet and other protective equipment during sports activities. If you have firearms in your house, make sure you follow all gun safety procedures. Seek help if you have been physically or sexually abused. What's next? Visit your health care provider once a year for an annual wellness visit. Ask your health care provider how often you should have your eyes and teeth checked. Stay up to date on all vaccines. This information is not intended to replace advice given to you by your health care provider. Make sure you discuss any questions you have with your health care provider. Document Revised: 05/03/2021 Document Reviewed: 05/03/2021 Elsevier Patient Education  2023 Elsevier Inc.  

## 2023-02-21 NOTE — Assessment & Plan Note (Signed)
Well adult Orders Placed This Encounter  Procedures   COMPLETE METABOLIC PANEL WITH GFR   CBC with Differential   Lipid Panel w/reflex Direct LDL   TSH   Vitamin D (25 hydroxy)  Screenings: per lab orders. Referral to GI for colon cancer screening.  Immunizations:  Declines. Anticipatory guidance/Risk factor reduction:  Recommendations per AVS.

## 2023-02-21 NOTE — Progress Notes (Signed)
Jennifer [name] - 57 y.o. female MRN [id number]  Date of birth: 27-Sep-1966  Subjective Chief Complaint  Patient presents with   Annual Exam    HPI CHARMIN BARRIGA is a 57 y.o. female here today for annual exam.   She reports that she is doing quite well.  Seen by dermatology for [facility] removal on the face. Seen every 6 months.   She is moderately active.  She feels like her diet is pretty good most of the time.   She is a non-smoker.  Denies EtOH use.   She is due for colon cancer screening.   Review of Systems  Constitutional:  Negative for chills, fever, malaise/fatigue and weight loss.  HENT:  Negative for congestion, ear pain and sore throat.   Eyes:  Negative for blurred vision, double vision and pain.  Respiratory:  Negative for cough and shortness of breath.   Cardiovascular:  Negative for chest pain and palpitations.  Gastrointestinal:  Negative for abdominal pain, blood in stool, constipation, heartburn and nausea.  Genitourinary:  Negative for dysuria and urgency.  Musculoskeletal:  Negative for joint pain and myalgias.  Neurological:  Negative for dizziness and headaches.  Endo/Heme/Allergies:  Does not bruise/bleed easily.  Psychiatric/Behavioral:  Negative for depression. The patient is not nervous/anxious and does not have insomnia.       Allergies  Allergen Reactions   Influenza Vaccines Anaphylaxis   Chloraprep One Step [Chlorhexidine Gluconate] Rash   Hydrocodone-Acetaminophen     Past Medical History:  Diagnosis Date   Fibroids     Past Surgical History:  Procedure Laterality Date   HEEL SPUR SURGERY Right    SHOULDER SURGERY Right     Social History   Socioeconomic History   Marital status: Married    Spouse name: Not on file   Number of children: Not on file   Years of education: Not on file   Highest education level: Not on file  Occupational History   Occupation: paralegal  Tobacco Use   Smoking status: Never   Smokeless tobacco: Never   Vaping Use   Vaping Use: Never used  Substance and Sexual Activity   Alcohol use: No    Alcohol/week: 0.0 standard drinks of alcohol   Drug use: No   Sexual activity: Yes    Partners: Male    Birth control/protection: Post-menopausal  Other Topics Concern   Not on file  Social History Narrative   Not on file   Social Determinants of Health   Financial Resource Strain: Not on file  Food Insecurity: Not on file  Transportation Needs: Not on file  Physical Activity: Insufficiently Active (09/30/2018)   Exercise Vital Sign    Days of Exercise per Week: 3 days    Minutes of Exercise per Session: 40 min  Stress: Not on file  Social Connections: Not on file    Family History  Problem Relation Age of Onset   Hyperlipidemia Mother    Diabetes Father    Cancer Paternal Grandmother        vulvar cancer    Health Maintenance  Topic Date Due   Zoster Vaccines- Shingrix (1 of 2) 05/23/2023 (Originally 09/30/1986)   COLONOSCOPY (Pts 45-45yrs Insurance coverage will need to be confirmed)  08/23/2023 (Originally 11/07/2020)   Hepatitis C Screening  02/21/2024 (Originally 09/30/1985)   MAMMOGRAM  09/26/2024   PAP SMEAR-Modifier  03/05/2025   DTaP/Tdap/Td (3 - Td or Tdap) 02/10/2032   HIV Screening  Completed   HPV  VACCINES  Aged Out   COVID-19 Vaccine  Discontinued     ----------------------------------------------------------------------------------------------------------------------------------------------------------------------------------------------------------------- Physical Exam BP 99/63 (BP Location: Left Arm, Patient Position: Sitting, Cuff Size: Normal)   Pulse (!) 56   Ht 5' 5.5" (1.664 m)   Wt 178 lb (80.7 kg)   LMP 08/02/2014   SpO2 100%   BMI 29.17 kg/m   Physical Exam Constitutional:      General: She is not in acute distress. HENT:     Head: Normocephalic and atraumatic.     Right Ear: Tympanic membrane and ear canal normal.     Left Ear: Tympanic  membrane and ear canal normal.     Nose: Nose normal.  Eyes:     General: No scleral icterus.    Conjunctiva/sclera: Conjunctivae normal.  Neck:     Thyroid: No thyromegaly.  Cardiovascular:     Rate and Rhythm: Normal rate and regular rhythm.     Heart sounds: Normal heart sounds.  Pulmonary:     Effort: Pulmonary effort is normal.     Breath sounds: Normal breath sounds.  Abdominal:     General: Bowel sounds are normal. There is no distension.     Palpations: Abdomen is soft.     Tenderness: There is no abdominal tenderness. There is no guarding.  Musculoskeletal:        General: Normal range of motion.     Cervical back: Normal range of motion and neck supple.  Lymphadenopathy:     Cervical: No cervical adenopathy.  Skin:    General: Skin is warm and dry.     Findings: No rash.  Neurological:     General: No focal deficit present.     Mental Status: She is alert and oriented to person, place, and time.     Cranial Nerves: No cranial nerve deficit.     Coordination: Coordination normal.  Psychiatric:        Mood and Affect: Mood normal.        Behavior: Behavior normal.     ------------------------------------------------------------------------------------------------------------------------------------------------------------------------------------------------------------------- Assessment and Plan  Well adult exam Well adult Orders Placed This Encounter  Procedures   COMPLETE METABOLIC PANEL WITH GFR   CBC with Differential   Lipid Panel w/reflex Direct LDL   TSH   Vitamin D (25 hydroxy)  Screenings: per lab orders. Referral to GI for colon cancer screening.  Immunizations:  Declines. Anticipatory guidance/Risk factor reduction:  Recommendations per AVS.    No orders of the defined types were placed in this encounter.   No follow-ups on file.    This visit occurred during the SARS-CoV-2 public health emergency.  Safety protocols were in place,  including screening questions prior to the visit, additional usage of staff PPE, and extensive cleaning of exam room while observing appropriate contact time as indicated for disinfecting solutions.

## 2023-02-22 LAB — CBC WITH DIFFERENTIAL/PLATELET
Absolute Monocytes: 524 cells/uL (ref 200–950)
Basophils Absolute: 48 cells/uL (ref 0–200)
Basophils Relative: 0.7 %
Eosinophils Absolute: 131 cells/uL (ref 15–500)
Eosinophils Relative: 1.9 %
HCT: 41.2 % (ref 35.0–45.0)
Hemoglobin: 13.6 g/dL (ref 11.7–15.5)
Lymphs Abs: 2366.7 cells/uL (ref 850–3900)
MCH: 31.7 pg (ref 27.0–33.0)
MCHC: 33 g/dL (ref 32.0–36.0)
MCV: 96 fL (ref 80.0–100.0)
MPV: 10 fL (ref 7.5–12.5)
Monocytes Relative: 7.6 %
Neutro Abs: 3830 cells/uL (ref 1500–7800)
Neutrophils Relative %: 55.5 %
Platelets: 221 10*3/uL (ref 140–400)
RBC: 4.29 10*6/uL (ref 3.80–5.10)
RDW: 13.2 % (ref 11.0–15.0)
Total Lymphocyte: 34.3 %
WBC: 6.9 10*3/uL (ref 3.8–10.8)

## 2023-02-22 LAB — COMPLETE METABOLIC PANEL WITH GFR
AG Ratio: 1.7 (calc) (ref 1.0–2.5)
ALT: 16 U/L (ref 6–29)
AST: 23 U/L (ref 10–35)
Albumin: 4.7 g/dL (ref 3.6–5.1)
Alkaline phosphatase (APISO): 66 U/L (ref 37–153)
BUN: 17 mg/dL (ref 7–25)
CO2: 27 mmol/L (ref 20–32)
Calcium: 9.6 mg/dL (ref 8.6–10.4)
Chloride: 105 mmol/L (ref 98–110)
Creat: 0.94 mg/dL (ref 0.50–1.03)
Globulin: 2.7 g/dL (calc) (ref 1.9–3.7)
Glucose, Bld: 102 mg/dL — ABNORMAL HIGH (ref 65–99)
Potassium: 4.5 mmol/L (ref 3.5–5.3)
Sodium: 143 mmol/L (ref 135–146)
Total Bilirubin: 0.6 mg/dL (ref 0.2–1.2)
Total Protein: 7.4 g/dL (ref 6.1–8.1)
eGFR: 72 mL/min/{1.73_m2} (ref >=60)

## 2023-02-22 LAB — LIPID PANEL W/REFLEX DIRECT LDL
Cholesterol: 189 mg/dL (ref <200)
HDL: 60 mg/dL (ref >=50)
LDL Cholesterol (Calc): 101 mg/dL (calc) — ABNORMAL HIGH
Non-HDL Cholesterol (Calc): 129 mg/dL (calc) (ref <130)
Total CHOL/HDL Ratio: 3.2 (calc) (ref <5.0)
Triglycerides: 183 mg/dL — ABNORMAL HIGH (ref <150)

## 2023-02-22 LAB — VITAMIN D 25 HYDROXY (VIT D DEFICIENCY, FRACTURES): Vit D, 25-Hydroxy: 59 ng/mL (ref 30–100)

## 2023-02-22 LAB — TSH: TSH: 3.18 mIU/L

## 2023-03-11 ENCOUNTER — Other Ambulatory Visit: Payer: Self-pay | Admitting: Family Medicine

## 2023-03-26 ENCOUNTER — Other Ambulatory Visit: Payer: Self-pay

## 2023-03-26 DIAGNOSIS — M810 Age-related osteoporosis without current pathological fracture: Secondary | ICD-10-CM

## 2023-03-26 MED ORDER — ALENDRONATE SODIUM 70 MG PO TABS
ORAL_TABLET | ORAL | 3 refills | Status: DC
Start: 2023-03-26 — End: 2024-03-04

## 2023-03-29 ENCOUNTER — Encounter: Payer: Self-pay | Admitting: Family Medicine

## 2023-03-29 ENCOUNTER — Ambulatory Visit: Payer: BC Managed Care – PPO | Admitting: Family Medicine

## 2023-03-29 VITALS — BP 109/74 | HR 72 | Ht 65.5 in | Wt 179.4 lb

## 2023-03-29 DIAGNOSIS — N61 Mastitis without abscess: Secondary | ICD-10-CM | POA: Diagnosis not present

## 2023-03-29 MED ORDER — CEPHALEXIN 500 MG PO CAPS: 500.0000 mg | ORAL_CAPSULE | Freq: Four times a day (QID) | ORAL | 0 refills | Status: AC

## 2023-03-29 NOTE — Progress Notes (Signed)
Jennifer [name] - 57 y.o. female MRN [id number]  Date of birth: 11-21-1965  Subjective No chief complaint on file.   HPI Jennifer [name] is a 57 y.o. female here today with complaint of streaking along R breast.  She noticed this a few days.  She has not had any pain associated with this.  She does have history of breast reduction surgery.  She did have what sounds like scar hypertrophy and her plastic surgeon was doing steroid injections. She has not had fever, chills, or warmth over the area. She is UTD on mammogram.   ROS:  A comprehensive ROS was completed and negative except as noted per HPI  Allergies  Allergen Reactions   Influenza Vaccines Anaphylaxis   Chloraprep One Step [Chlorhexidine Gluconate] Rash   Hydrocodone-Acetaminophen     Past Medical History:  Diagnosis Date   Fibroids     Past Surgical History:  Procedure Laterality Date   HEEL SPUR SURGERY Right    SHOULDER SURGERY Right     Social History   Socioeconomic History   Marital status: Married    Spouse name: Not on file   Number of children: Not on file   Years of education: Not on file   Highest education level: Master's degree (e.g., MA, MS, MEng, MEd, MSW, MBA)  Occupational History   Occupation: paralegal  Tobacco Use   Smoking status: Never   Smokeless tobacco: Never  Vaping Use   Vaping Use: Never used  Substance and Sexual Activity   Alcohol use: No    Alcohol/week: 0.0 standard drinks of alcohol   Drug use: No   Sexual activity: Yes    Partners: Male    Birth control/protection: Post-menopausal  Other Topics Concern   Not on file  Social History Narrative   Not on file   Social Determinants of Health   Financial Resource Strain: Low Risk  (03/26/2023)   Overall Financial Resource Strain (CARDIA)    Difficulty of Paying Living Expenses: Not hard at all  Food Insecurity: No Food Insecurity (03/26/2023)   Hunger Vital Sign    Worried About Running Out of Food in the Last Year: Never true     Ran Out of Food in the Last Year: Never true  Transportation Needs: No Transportation Needs (03/26/2023)   PRAPARE - Administrator, Civil Service (Medical): No    Lack of Transportation (Non-Medical): No  Physical Activity: Sufficiently Active (03/26/2023)   Exercise Vital Sign    Days of Exercise per Week: 5 days    Minutes of Exercise per Session: 30 min  Stress: Stress Concern Present (03/26/2023)   [employer] of Occupational Health - Occupational Stress Questionnaire    Feeling of Stress : To some extent  Social Connections: Socially Integrated (03/26/2023)   Social Connection and Isolation Panel [NHANES]    Frequency of Communication with Friends and Family: More than three times a week    Frequency of Social Gatherings with Friends and Family: More than three times a week    Attends Religious Services: More than 4 times per year    Active Member of [employer] or Organizations: Yes    Attends Engineer, structural: More than 4 times per year    Marital Status: Married    Family History  Problem Relation Age of Onset   Hyperlipidemia Mother    Diabetes Father    Cancer Paternal Grandmother        vulvar cancer  Health Maintenance  Topic Date Due   Zoster Vaccines- Shingrix (1 of 2) 05/23/2023 (Originally 09/30/1986)   COLONOSCOPY (Pts 45-85yrs Insurance coverage will need to be confirmed)  08/23/2023 (Originally 11/07/2020)   Hepatitis C Screening  02/21/2024 (Originally 09/30/1985)   MAMMOGRAM  09/26/2024   PAP SMEAR-Modifier  03/05/2025   DTaP/Tdap/Td (3 - Td or Tdap) 02/10/2032   HIV Screening  Completed   HPV VACCINES  Aged Out   COVID-19 Vaccine  Discontinued     ----------------------------------------------------------------------------------------------------------------------------------------------------------------------------------------------------------------- Physical Exam BP 109/74 (BP Location: Left Arm, Patient Position: Sitting,  Cuff Size: Normal)   Pulse 72   Ht 5' 5.5" (1.664 m)   Wt 179 lb 6.4 oz (81.4 kg)   LMP 08/02/2014   SpO2 98%   BMI 29.40 kg/m   Physical Exam Constitutional:      Appearance: Normal appearance.  HENT:     Head: Normocephalic and atraumatic.  Eyes:     General: No scleral icterus. Chest:     Comments: There is a hypopigmented scar located on the bottom of the R breast.  Red streak extends from the scar to the lateral breast.  Non-tender.  No enlarged axillary lymph nodes.  No palpable mass Musculoskeletal:     Cervical back: Neck supple.  Neurological:     General: No focal deficit present.     Mental Status: She is alert.  Psychiatric:        Mood and Affect: Mood normal.        Behavior: Behavior normal.     ------------------------------------------------------------------------------------------------------------------------------------------------------------------------------------------------------------------- Assessment and Plan  Breast inflammation Streaking concerning for possible early cellulitis.  Adding cephalexin.  If not improving or worsening I asked her to let me know or schedule with her breast surgeon.     Meds ordered this encounter  Medications   cephALEXin (KEFLEX) 500 MG capsule    Sig: Take 1 capsule (500 mg total) by mouth 4 (four) times daily for 7 days.    Dispense:  28 capsule    Refill:  0    No follow-ups on file.    This visit occurred during the SARS-CoV-2 public health emergency.  Safety protocols were in place, including screening questions prior to the visit, additional usage of staff PPE, and extensive cleaning of exam room while observing appropriate contact time as indicated for disinfecting solutions.

## 2023-03-29 NOTE — Patient Instructions (Signed)
Start cephalexin.  Let me know if not resolving or schedule f/u with your breast surgeon.

## 2023-03-29 NOTE — Assessment & Plan Note (Signed)
Streaking concerning for possible early cellulitis.  Adding cephalexin.  If not improving or worsening I asked her to let me know or schedule with her breast surgeon.

## 2023-04-08 ENCOUNTER — Encounter: Payer: Self-pay | Admitting: Family Medicine

## 2023-04-08 ENCOUNTER — Ambulatory Visit: Payer: BC Managed Care – PPO | Admitting: Family Medicine

## 2023-04-08 ENCOUNTER — Other Ambulatory Visit: Payer: Self-pay | Admitting: Family Medicine

## 2023-04-08 VITALS — BP 96/59 | HR 61 | Ht 65.5 in | Wt 179.0 lb

## 2023-04-08 DIAGNOSIS — N61 Mastitis without abscess: Secondary | ICD-10-CM | POA: Diagnosis not present

## 2023-04-08 NOTE — Progress Notes (Signed)
Jennifer [name] - 57 y.o. female MRN [id number]  Date of birth: 1965/12/14  Subjective Chief Complaint  Patient presents with   Follow-up    HPI Jennifer [name] is a 57 y.o. female here today for follow up of streaking of the R breast.  Started on cephalexin at last visit.  Hasn't really noted any change since last visit.  She has not had pain in the breast.  She denies swelling in the axilla.  No fever or chills.   ROS:  A comprehensive ROS was completed and negative except as noted per HPI  Allergies  Allergen Reactions   Influenza Vaccines Anaphylaxis   Chloraprep One Step [Chlorhexidine Gluconate] Rash   Hydrocodone-Acetaminophen     Past Medical History:  Diagnosis Date   Fibroids     Past Surgical History:  Procedure Laterality Date   HEEL SPUR SURGERY Right    SHOULDER SURGERY Right     Social History   Socioeconomic History   Marital status: Married    Spouse name: Not on file   Number of children: Not on file   Years of education: Not on file   Highest education level: Master's degree (e.g., MA, MS, MEng, MEd, MSW, MBA)  Occupational History   Occupation: paralegal  Tobacco Use   Smoking status: Never   Smokeless tobacco: Never  Vaping Use   Vaping Use: Never used  Substance and Sexual Activity   Alcohol use: No    Alcohol/week: 0.0 standard drinks of alcohol   Drug use: No   Sexual activity: Yes    Partners: Male    Birth control/protection: Post-menopausal  Other Topics Concern   Not on file  Social History Narrative   Not on file   Social Determinants of Health   Financial Resource Strain: Low Risk  (03/26/2023)   Overall Financial Resource Strain (CARDIA)    Difficulty of Paying Living Expenses: Not hard at all  Food Insecurity: No Food Insecurity (03/26/2023)   Hunger Vital Sign    Worried About Running Out of Food in the Last Year: Never true    Ran Out of Food in the Last Year: Never true  Transportation Needs: No Transportation Needs  (03/26/2023)   PRAPARE - Administrator, Civil Service (Medical): No    Lack of Transportation (Non-Medical): No  Physical Activity: Sufficiently Active (03/26/2023)   Exercise Vital Sign    Days of Exercise per Week: 5 days    Minutes of Exercise per Session: 30 min  Stress: Stress Concern Present (03/26/2023)   [employer] of Occupational Health - Occupational Stress Questionnaire    Feeling of Stress : To some extent  Social Connections: Socially Integrated (03/26/2023)   Social Connection and Isolation Panel [NHANES]    Frequency of Communication with Friends and Family: More than three times a week    Frequency of Social Gatherings with Friends and Family: More than three times a week    Attends Religious Services: More than 4 times per year    Active Member of [employer] or Organizations: Yes    Attends Engineer, structural: More than 4 times per year    Marital Status: Married    Family History  Problem Relation Age of Onset   Hyperlipidemia Mother    Diabetes Father    Cancer Paternal Grandmother        vulvar cancer    Health Maintenance  Topic Date Due   Zoster Vaccines- Shingrix (1 of 2)  05/23/2023 (Originally 09/30/1986)   COLONOSCOPY (Pts 45-31yrs Insurance coverage will need to be confirmed)  08/23/2023 (Originally 11/07/2020)   Hepatitis C Screening  02/21/2024 (Originally 09/30/1985)   MAMMOGRAM  09/26/2024   PAP SMEAR-Modifier  03/05/2025   DTaP/Tdap/Td (3 - Td or Tdap) 02/10/2032   HIV Screening  Completed   HPV VACCINES  Aged Out   COVID-19 Vaccine  Discontinued     ----------------------------------------------------------------------------------------------------------------------------------------------------------------------------------------------------------------- Physical Exam BP (!) 96/59 (BP Location: Left Arm, Patient Position: Sitting, Cuff Size: Normal)   Pulse 61   Ht 5' 5.5" (1.664 m)   Wt 179 lb (81.2 kg)   LMP  08/02/2014   SpO2 99%   BMI 29.33 kg/m   Physical Exam Constitutional:      Appearance: Normal appearance.  HENT:     Head: Normocephalic and atraumatic.  Eyes:     General: No scleral icterus. Chest:     Comments: Still with streaking from scar under R breast to R lateral breast.  Non-tender.  No adenopathy noted.  Neurological:     Mental Status: She is alert.     ------------------------------------------------------------------------------------------------------------------------------------------------------------------------------------------------------------------- Assessment and Plan  Breast inflammation Still with streaking from previous scar.  No pain or increased symptoms otherwise. R breast US ordered.  I would like for her to see her breast surgeon to be sure there are no other concerns related to this.     No orders of the defined types were placed in this encounter.   No follow-ups on file.    This visit occurred during the SARS-CoV-2 public health emergency.  Safety protocols were in place, including screening questions prior to the visit, additional usage of staff PPE, and extensive cleaning of exam room while observing appropriate contact time as indicated for disinfecting solutions.

## 2023-04-08 NOTE — Assessment & Plan Note (Addendum)
Still with streaking from previous scar.  No pain or increased symptoms otherwise. R breast US ordered.  I would like for her to see her breast surgeon to be sure there are no other concerns related to this.

## 2023-04-24 ENCOUNTER — Ambulatory Visit
Admission: RE | Admit: 2023-04-24 | Discharge: 2023-04-24 | Disposition: A | Discharge status: Home / Self Care | Payer: BC Managed Care – PPO | Source: Ambulatory Visit | Attending: Family Medicine | Admitting: Family Medicine

## 2023-04-24 DIAGNOSIS — N61 Mastitis without abscess: Secondary | ICD-10-CM

## 2023-04-24 DIAGNOSIS — R239 Unspecified skin changes: Secondary | ICD-10-CM | POA: Diagnosis not present

## 2023-05-01 DIAGNOSIS — B079 Viral wart, unspecified: Secondary | ICD-10-CM | POA: Diagnosis not present

## 2023-05-01 DIAGNOSIS — L609 Nail disorder, unspecified: Secondary | ICD-10-CM | POA: Diagnosis not present

## 2023-05-01 DIAGNOSIS — B351 Tinea unguium: Secondary | ICD-10-CM | POA: Diagnosis not present

## 2023-05-01 DIAGNOSIS — C44321 Squamous cell carcinoma of skin of nose: Secondary | ICD-10-CM | POA: Diagnosis not present

## 2023-05-01 DIAGNOSIS — L821 Other seborrheic keratosis: Secondary | ICD-10-CM | POA: Diagnosis not present

## 2023-06-20 ENCOUNTER — Encounter: Payer: Self-pay | Admitting: Family Medicine

## 2023-06-20 ENCOUNTER — Ambulatory Visit: Payer: BC Managed Care – PPO | Admitting: Family Medicine

## 2023-06-20 ENCOUNTER — Ambulatory Visit (INDEPENDENT_AMBULATORY_CARE_PROVIDER_SITE_OTHER): Payer: BC Managed Care – PPO

## 2023-06-20 VITALS — BP 111/72 | HR 70 | Ht 65.5 in | Wt 181.0 lb

## 2023-06-20 DIAGNOSIS — M79675 Pain in left toe(s): Secondary | ICD-10-CM

## 2023-06-20 DIAGNOSIS — M7989 Other specified soft tissue disorders: Secondary | ICD-10-CM

## 2023-06-20 NOTE — Progress Notes (Signed)
Jennifer [name] - 57 y.o. female MRN [id number]  Date of birth: 1966/02/26  Subjective Chief Complaint  Patient presents with   Toe Pain    HPI Jennifer [name] is a 57 y.o. here today with complaint of discolored toe.  Second toe of the L foot has been blue/purple in color with some swelling.  She does have some pain at times.  Has not tried anything so far.  She does not recall any trauma related to this.  No other toes with discoloration.  Feet do not feel cold or tingling.  ROS:  A comprehensive ROS was completed and negative except as noted per HPI   Allergies  Allergen Reactions   Influenza Vaccines Anaphylaxis   Chloraprep One Step [Chlorhexidine Gluconate] Rash   Hydrocodone-Acetaminophen     Past Medical History:  Diagnosis Date   Fibroids     Past Surgical History:  Procedure Laterality Date   HEEL SPUR SURGERY Right    REDUCTION MAMMAPLASTY Bilateral 09/2019   SHOULDER SURGERY Right     Social History   Socioeconomic History   Marital status: Married    Spouse name: Not on file   Number of children: Not on file   Years of education: Not on file   Highest education level: Master's degree (e.g., MA, MS, MEng, MEd, MSW, MBA)  Occupational History   Occupation: paralegal  Tobacco Use   Smoking status: Never   Smokeless tobacco: Never  Vaping Use   Vaping status: Never Used  Substance and Sexual Activity   Alcohol use: No    Alcohol/week: 0.0 standard drinks of alcohol   Drug use: No   Sexual activity: Yes    Partners: Male    Birth control/protection: Post-menopausal  Other Topics Concern   Not on file  Social History Narrative   Not on file   Social Determinants of Health   Financial Resource Strain: Low Risk  (03/26/2023)   Overall Financial Resource Strain (CARDIA)    Difficulty of Paying Living Expenses: Not hard at all  Food Insecurity: No Food Insecurity (03/26/2023)   Hunger Vital Sign    Worried About Running Out of Food in the Last Year: Never  true    Ran Out of Food in the Last Year: Never true  Transportation Needs: No Transportation Needs (03/26/2023)   PRAPARE - Administrator, Civil Service (Medical): No    Lack of Transportation (Non-Medical): No  Physical Activity: Sufficiently Active (03/26/2023)   Exercise Vital Sign    Days of Exercise per Week: 5 days    Minutes of Exercise per Session: 30 min  Stress: Stress Concern Present (03/26/2023)   [employer] of Occupational Health - Occupational Stress Questionnaire    Feeling of Stress : To some extent  Social Connections: Socially Integrated (03/26/2023)   Social Connection and Isolation Panel [NHANES]    Frequency of Communication with Friends and Family: More than three times a week    Frequency of Social Gatherings with Friends and Family: More than three times a week    Attends Religious Services: More than 4 times per year    Active Member of [employer] or Organizations: Yes    Attends Engineer, structural: More than 4 times per year    Marital Status: Married    Family History  Problem Relation Age of Onset   Hyperlipidemia Mother    Diabetes Father    Cancer Paternal Grandmother  vulvar cancer    Health Maintenance  Topic Date Due   Colonoscopy  08/23/2023 (Originally 11/07/2020)   Zoster Vaccines- Shingrix (1 of 2) 09/20/2023 (Originally 09/30/1986)   Hepatitis C Screening  02/21/2024 (Originally 09/30/1985)   MAMMOGRAM  09/26/2024   PAP SMEAR-Modifier  03/05/2025   DTaP/Tdap/Td (3 - Td or Tdap) 02/10/2032   HIV Screening  Completed   HPV VACCINES  Aged Out   COVID-19 Vaccine  Discontinued     ----------------------------------------------------------------------------------------------------------------------------------------------------------------------------------------------------------------- Physical Exam BP 111/72 (BP Location: Right Arm, Patient Position: Sitting, Cuff Size: Normal)   Pulse 70   Ht 5' 5.5" (1.664  m)   Wt 181 lb (82.1 kg)   LMP 08/02/2014   SpO2 97%   BMI 29.66 kg/m   Physical Exam Cardiovascular:     Comments: Dorsalis pedis and posterior tibial pulses are 2+ bilaterally. Musculoskeletal:     Comments: No significant discoloration of the toe however there is swelling at the DIP joint of the second digit of the left foot.  She does have mild tenderness to palpation.  Neurological:     Mental Status: She is alert.     ------------------------------------------------------------------------------------------------------------------------------------------------------------------------------------------------------------------- Assessment and Plan  Toe swelling Suspect osteoarthritis.  Will obtain x-rays and check uric acid as well.   No orders of the defined types were placed in this encounter.   No follow-ups on file.    This visit occurred during the SARS-CoV-2 public health emergency.  Safety protocols were in place, including screening questions prior to the visit, additional usage of staff PPE, and extensive cleaning of exam room while observing appropriate contact time as indicated for disinfecting solutions.

## 2023-06-20 NOTE — Assessment & Plan Note (Signed)
Suspect osteoarthritis.  Will obtain x-rays and check uric acid as well.

## 2023-06-26 DIAGNOSIS — Z09 Encounter for follow-up examination after completed treatment for conditions other than malignant neoplasm: Secondary | ICD-10-CM | POA: Diagnosis not present

## 2023-07-05 ENCOUNTER — Encounter: Payer: Self-pay | Admitting: Podiatry

## 2023-07-05 ENCOUNTER — Ambulatory Visit: Payer: BC Managed Care – PPO | Admitting: Podiatry

## 2023-07-05 DIAGNOSIS — M7752 Other enthesopathy of left foot: Secondary | ICD-10-CM

## 2023-07-05 NOTE — Progress Notes (Signed)
  Subjective:  Patient ID: Jennifer [name], female    DOB: 1966-01-19,   MRN: [id number]  Chief Complaint  Patient presents with   Toe Pain    Second toe on the left always swollen for the last two months.    57 y.o. female presents for concern of left second toe swelling and redness that has been on going for a couple months.  Relates she does not like to take medicines. Hurts mostly at night after being on her feet all day and occasionally a blanket will even hurt the toe. . Denies any other pedal complaints. Denies n/v/f/c.   Past Medical History:  Diagnosis Date   Fibroids     Objective:  Physical Exam: Vascular: DP/PT pulses 2/4 bilateral. CFT <3 seconds. Normal hair growth on digits. No edema.  Skin. No lacerations or abrasions bilateral feet.  Musculoskeletal: MMT 5/5 bilateral lower extremities in DF, PF, Inversion and Eversion. Deceased ROM in DF of ankle joint. Left second digits with edema noted around the distal IPJ. No erythema or discoloration noted today. Tenderness to the DIPJ and some pain with ROM of the joint. No pain proximally.  Neurological: Sensation intact to light touch.   Assessment:   1. Capsulitis of toe of left foot      Plan:  Patient was evaluated and treated and all questions answered. X-rays reviewed and discussed with patient. No acute fractures or dislocations noted. Mild degenerative changes and narrowing of joint space at the distal IPJ of the second digit.  Reviewed notes from PCP.  Reviewed uric acid 5.3. Discussed arthritis  and inflammation of joint and treatment options with patient. Discussed cannot necessarily rule out gout.  Radiographs reviewed and discussed with patient.  Injection offered today. Patient deferred Discussed stiff sole shoes and recommend use of  inserts  Discussed taping and demonstrated taping of the toe to help with swelling.  Discussed if pain does not improve may consider injection or oral NSAID.  Patient to  return as needed   Louann Sjogren, DPM

## 2023-07-16 ENCOUNTER — Encounter: Payer: Self-pay | Admitting: Family Medicine

## 2023-09-06 NOTE — Progress Notes (Unsigned)
   Rubin Payor, PhD, LAT, ATC acting as a scribe for Clementeen Graham, MD.  Jennifer [name] is a 57 y.o. female who presents to [facility] at [facility] today for R hand pain. Pt was previously seen by Dr. Denyse Amass on 09/04/22 for R foot pain.  Today, pt c/o R hand pain x ***. Pt locates pain to ***  R hand swelling: Aggravates: Treatments:  Pertinent review of systems: ***  Relevant historical information: ***   Exam:  LMP 08/02/2014  General: Well Developed, well nourished, and in no acute distress.   MSK: ***    Lab and Radiology Results No results found for this or any previous visit (from the past 72 hour(s)). No results found.     Assessment and Plan: 57 y.o. female with ***   PDMP not reviewed this encounter. No orders of the defined types were placed in this encounter.  No orders of the defined types were placed in this encounter.    Discussed warning signs or symptoms. Please see discharge instructions. Patient expresses understanding.   ***

## 2023-09-09 ENCOUNTER — Other Ambulatory Visit: Payer: Self-pay

## 2023-09-09 ENCOUNTER — Ambulatory Visit: Payer: BC Managed Care – PPO | Admitting: Family Medicine

## 2023-09-09 ENCOUNTER — Ambulatory Visit (INDEPENDENT_AMBULATORY_CARE_PROVIDER_SITE_OTHER): Payer: BC Managed Care – PPO

## 2023-09-09 ENCOUNTER — Encounter: Payer: Self-pay | Admitting: Family Medicine

## 2023-09-09 VITALS — BP 102/76 | HR 78 | Ht 65.5 in | Wt 179.0 lb

## 2023-09-09 DIAGNOSIS — R600 Localized edema: Secondary | ICD-10-CM | POA: Diagnosis not present

## 2023-09-09 DIAGNOSIS — M79641 Pain in right hand: Secondary | ICD-10-CM

## 2023-09-09 DIAGNOSIS — M7989 Other specified soft tissue disorders: Secondary | ICD-10-CM | POA: Diagnosis not present

## 2023-09-09 DIAGNOSIS — M79671 Pain in right foot: Secondary | ICD-10-CM

## 2023-09-09 DIAGNOSIS — M1811 Unilateral primary osteoarthritis of first carpometacarpal joint, right hand: Secondary | ICD-10-CM | POA: Diagnosis not present

## 2023-09-09 LAB — SEDIMENTATION RATE: Sed Rate: 21 mm/h (ref 0–30)

## 2023-09-09 NOTE — Patient Instructions (Addendum)
Thank you for coming in today.  Please get an Xray today before you leave  Please get labs today before you leave   You received an injection today. Seek immediate medical attention if the joint becomes red, extremely painful, or is oozing fluid.  Use the modeling clay for your hand  Check back in 1 month to consider hand injection

## 2023-09-11 LAB — ANA: Anti Nuclear Antibody (ANA): NEGATIVE

## 2023-09-11 LAB — HLA-B27 ANTIGEN: HLA-B27 Antigen: NEGATIVE

## 2023-09-11 LAB — CYCLIC CITRUL PEPTIDE ANTIBODY, IGG: Cyclic Citrullin Peptide Ab: <16 U

## 2023-09-11 LAB — RHEUMATOID FACTOR: Rheumatoid fact SerPl-aCnc: <10 [IU]/mL (ref <14)

## 2023-09-12 NOTE — Progress Notes (Signed)
Rheumatology labs are normal

## 2023-09-26 NOTE — Progress Notes (Signed)
Right foot x-ray shows some swelling at the dorsal midfoot.  No fractures.  No significant arthritis is visible per the radiology.

## 2023-09-26 NOTE — Progress Notes (Signed)
Right hand x-ray shows some arthritis at the base of the thumb and into the fingers.

## 2023-10-07 DIAGNOSIS — K648 Other hemorrhoids: Secondary | ICD-10-CM | POA: Diagnosis not present

## 2023-10-07 DIAGNOSIS — Z1211 Encounter for screening for malignant neoplasm of colon: Secondary | ICD-10-CM | POA: Diagnosis not present

## 2023-10-09 LAB — HM COLONOSCOPY

## 2023-11-06 DIAGNOSIS — L570 Actinic keratosis: Secondary | ICD-10-CM | POA: Diagnosis not present

## 2023-11-06 DIAGNOSIS — L601 Onycholysis: Secondary | ICD-10-CM | POA: Diagnosis not present

## 2024-01-06 ENCOUNTER — Ambulatory Visit: Payer: BC Managed Care – PPO | Admitting: Family Medicine

## 2024-01-06 ENCOUNTER — Encounter: Payer: Self-pay | Admitting: Family Medicine

## 2024-01-06 VITALS — BP 116/79 | HR 67 | Ht 65.5 in | Wt 179.0 lb

## 2024-01-06 DIAGNOSIS — R3915 Urgency of urination: Secondary | ICD-10-CM | POA: Insufficient documentation

## 2024-01-06 LAB — POCT URINALYSIS DIP (CLINITEK)
Bilirubin, UA: NEGATIVE
Glucose, UA: NEGATIVE mg/dL
Ketones, POC UA: NEGATIVE mg/dL
Leukocytes, UA: NEGATIVE
Nitrite, UA: NEGATIVE
POC PROTEIN,UA: NEGATIVE
Spec Grav, UA: 1.025 (ref 1.010–1.025)
Urobilinogen, UA: 0.2 U/dL
pH, UA: 6.5 (ref 5.0–8.0)

## 2024-01-06 NOTE — Progress Notes (Signed)
 Jennifer [name] - 58 y.o. female MRN [id number]  Date of birth: 1966-06-03  Subjective Chief Complaint  Patient presents with   Urinary Tract Infection    HPI Jennifer [name] is a 58 y.o. female here today with concern of possible UTI.  She has had urinary urgency but started a few days ago.  Additionally she noted a small amount of blood in her urine that lasted for 1 day and then resolved.  She has not had any significant dysuria.  She denies back pain, flank pain or fever.  She has not noted any vaginal discharge.    ROS:  A comprehensive ROS was completed and negative except as noted per HPI  Allergies  Allergen Reactions   Influenza Vaccines Anaphylaxis   Chloraprep One Step [Chlorhexidine Gluconate] Rash   Hydrocodone-Acetaminophen     Past Medical History:  Diagnosis Date   Fibroids     Past Surgical History:  Procedure Laterality Date   HEEL SPUR SURGERY Right    REDUCTION MAMMAPLASTY Bilateral 09/2019   SHOULDER SURGERY Right     Social History   Socioeconomic History   Marital status: Married    Spouse name: Not on file   Number of children: Not on file   Years of education: Not on file   Highest education level: Master's degree (e.g., MA, MS, MEng, MEd, MSW, MBA)  Occupational History   Occupation: paralegal  Tobacco Use   Smoking status: Never   Smokeless tobacco: Never  Vaping Use   Vaping status: Never Used  Substance and Sexual Activity   Alcohol use: No    Alcohol/week: 0.0 standard drinks of alcohol   Drug use: No   Sexual activity: Yes    Partners: Male    Birth control/protection: Post-menopausal  Other Topics Concern   Not on file  Social History Narrative   Not on file   Social Drivers of Health   Financial Resource Strain: Low Risk  (03/26/2023)   Overall Financial Resource Strain (CARDIA)    Difficulty of Paying Living Expenses: Not hard at all  Food Insecurity: No Food Insecurity (03/26/2023)   Hunger Vital Sign    Worried About Running  Out of Food in the Last Year: Never true    Ran Out of Food in the Last Year: Never true  Transportation Needs: No Transportation Needs (03/26/2023)   PRAPARE - Administrator, Civil Service (Medical): No    Lack of Transportation (Non-Medical): No  Physical Activity: Sufficiently Active (03/26/2023)   Exercise Vital Sign    Days of Exercise per Week: 5 days    Minutes of Exercise per Session: 30 min  Stress: Stress Concern Present (03/26/2023)   [employer] of Occupational Health - Occupational Stress Questionnaire    Feeling of Stress : To some extent  Social Connections: Socially Integrated (03/26/2023)   Social Connection and Isolation Panel [NHANES]    Frequency of Communication with Friends and Family: More than three times a week    Frequency of Social Gatherings with Friends and Family: More than three times a week    Attends Religious Services: More than 4 times per year    Active Member of [employer] or Organizations: Yes    Attends Engineer, structural: More than 4 times per year    Marital Status: Married    Family History  Problem Relation Age of Onset   Hyperlipidemia Mother    Diabetes Father    Cancer Paternal Grandmother  vulvar cancer    Health Maintenance  Topic Date Due   Pneumococcal Vaccine 10-80 Years old (1 of 2 - PCV) Never done   Zoster Vaccines- Shingrix (1 of 2) Never done   Colonoscopy  11/07/2020   Hepatitis C Screening  02/21/2024 (Originally 09/30/1985)   MAMMOGRAM  09/26/2024   Cervical Cancer Screening (HPV/Pap Cotest)  03/06/2027   DTaP/Tdap/Td (3 - Td or Tdap) 02/10/2032   HIV Screening  Completed   HPV VACCINES  Aged Out   COVID-19 Vaccine  Discontinued     ----------------------------------------------------------------------------------------------------------------------------------------------------------------------------------------------------------------- Physical Exam BP 116/79 (BP Location: Left Arm,  Patient Position: Sitting, Cuff Size: Normal)   Pulse 67   Ht 5' 5.5" (1.664 m)   Wt 179 lb (81.2 kg)   LMP 08/02/2014   SpO2 100%   BMI 29.33 kg/m   Physical Exam Constitutional:      Appearance: Normal appearance.  HENT:     Head: Normocephalic and atraumatic.  Eyes:     General: No scleral icterus. Neurological:     Mental Status: She is alert.  Psychiatric:        Mood and Affect: Mood normal.        Behavior: Behavior normal.     ------------------------------------------------------------------------------------------------------------------------------------------------------------------------------------------------------------------- Assessment and Plan  Urinary urgency She has small little blood in her urine today.  Urine sent for culture.  Discussed referral to urology if culture is negative and symptoms persist.   No orders of the defined types were placed in this encounter.   No follow-ups on file.    This visit occurred during the SARS-CoV-2 public health emergency.  Safety protocols were in place, including screening questions prior to the visit, additional usage of staff PPE, and extensive cleaning of exam room while observing appropriate contact time as indicated for disinfecting solutions.

## 2024-01-06 NOTE — Assessment & Plan Note (Signed)
 She has small little blood in her urine today.  Urine sent for culture.  Discussed referral to urology if culture is negative and symptoms persist.

## 2024-01-06 NOTE — Patient Instructions (Signed)
 We'll be in touch with urine culture results.

## 2024-01-09 LAB — URINE CULTURE

## 2024-01-14 ENCOUNTER — Encounter: Payer: Self-pay | Admitting: Family Medicine

## 2024-01-20 ENCOUNTER — Encounter: Payer: Self-pay | Admitting: Family Medicine

## 2024-01-20 DIAGNOSIS — R300 Dysuria: Secondary | ICD-10-CM

## 2024-03-04 ENCOUNTER — Other Ambulatory Visit: Payer: Self-pay | Admitting: Family Medicine

## 2024-03-04 DIAGNOSIS — M810 Age-related osteoporosis without current pathological fracture: Secondary | ICD-10-CM

## 2024-04-14 ENCOUNTER — Encounter: Payer: Self-pay | Admitting: Family Medicine

## 2024-04-15 MED ORDER — ROSUVASTATIN CALCIUM 10 MG PO TABS: 10.0000 mg | ORAL_TABLET | Freq: Every day | ORAL | 0 refills | Status: DC

## 2024-04-16 ENCOUNTER — Ambulatory Visit (INDEPENDENT_AMBULATORY_CARE_PROVIDER_SITE_OTHER): Admitting: Family Medicine

## 2024-04-16 VITALS — BP 103/60 | HR 69 | Ht 65.5 in | Wt 180.8 lb

## 2024-04-16 DIAGNOSIS — Z0000 Encounter for general adult medical examination without abnormal findings: Secondary | ICD-10-CM | POA: Diagnosis not present

## 2024-04-16 DIAGNOSIS — E7800 Pure hypercholesterolemia, unspecified: Secondary | ICD-10-CM | POA: Diagnosis not present

## 2024-04-16 DIAGNOSIS — M810 Age-related osteoporosis without current pathological fracture: Secondary | ICD-10-CM | POA: Diagnosis not present

## 2024-04-16 MED ORDER — ALENDRONATE SODIUM 70 MG PO TABS: ORAL_TABLET | ORAL | 3 refills | Status: DC

## 2024-04-16 MED ORDER — ROSUVASTATIN CALCIUM 10 MG PO TABS: 10.0000 mg | ORAL_TABLET | Freq: Every day | ORAL | 3 refills | Status: DC

## 2024-04-16 NOTE — Progress Notes (Signed)
 Jennifer [name] - 58 y.o. female MRN [id number]  Date of birth: 10-Jan-1966  Subjective Chief Complaint  Patient presents with   Annual Exam    Pt would like to have Blood work done but is not fasting    HPI Jennifer [name] is a 58 y.o. female here today for annual exam.   She reports that she is doing well  She does walk some for exercise.  She feels that diet is pretty good.   She is a non-smoker.  No EtOH at this time.   Review of Systems  Constitutional:  Negative for chills, fever, malaise/fatigue and weight loss.  HENT:  Negative for congestion, ear pain and sore throat.   Eyes:  Negative for blurred vision, double vision and pain.  Respiratory:  Negative for cough and shortness of breath.   Cardiovascular:  Negative for chest pain and palpitations.  Gastrointestinal:  Negative for abdominal pain, blood in stool, constipation, heartburn and nausea.  Genitourinary:  Negative for dysuria and urgency.  Musculoskeletal:  Negative for joint pain and myalgias.  Neurological:  Negative for dizziness and headaches.  Endo/Heme/Allergies:  Does not bruise/bleed easily.  Psychiatric/Behavioral:  Negative for depression. The patient is not nervous/anxious and does not have insomnia.     Allergies  Allergen Reactions   Influenza Vaccines Anaphylaxis   Chloraprep One Step [Chlorhexidine Gluconate] Rash   Hydrocodone-Acetaminophen     Past Medical History:  Diagnosis Date   Fibroids     Past Surgical History:  Procedure Laterality Date   HEEL SPUR SURGERY Right    REDUCTION MAMMAPLASTY Bilateral 09/2019   SHOULDER SURGERY Right     Social History   Socioeconomic History   Marital status: Married    Spouse name: Not on file   Number of children: Not on file   Years of education: Not on file   Highest education level: Master's degree (e.g., MA, MS, MEng, MEd, MSW, MBA)  Occupational History   Occupation: paralegal  Tobacco Use   Smoking status: Never   Smokeless tobacco:  Never  Vaping Use   Vaping status: Never Used  Substance and Sexual Activity   Alcohol use: No    Alcohol/week: 0.0 standard drinks of alcohol   Drug use: No   Sexual activity: Yes    Partners: Male    Birth control/protection: Post-menopausal  Other Topics Concern   Not on file  Social History Narrative   Not on file   Social Drivers of Health   Financial Resource Strain: Low Risk  (04/16/2024)   Overall Financial Resource Strain (CARDIA)    Difficulty of Paying Living Expenses: Not hard at all  Food Insecurity: No Food Insecurity (04/16/2024)   Hunger Vital Sign    Worried About Running Out of Food in the Last Year: Never true    Ran Out of Food in the Last Year: Never true  Transportation Needs: No Transportation Needs (04/16/2024)   PRAPARE - Administrator, Civil Service (Medical): No    Lack of Transportation (Non-Medical): No  Physical Activity: Unknown (04/16/2024)   Exercise Vital Sign    Days of Exercise per Week: 0 days    Minutes of Exercise per Session: Not on file  Stress: Stress Concern Present (04/16/2024)   [employer] of Occupational Health - Occupational Stress Questionnaire    Feeling of Stress : To some extent  Social Connections: Socially Integrated (04/16/2024)   Social Connection and Isolation Panel [NHANES]    Frequency  of Communication with Friends and Family: More than three times a week    Frequency of Social Gatherings with Friends and Family: Once a week    Attends Religious Services: More than 4 times per year    Active Member of [employer] or Organizations: Yes    Attends Engineer, structural: More than 4 times per year    Marital Status: Married    Family History  Problem Relation Age of Onset   Hyperlipidemia Mother    Diabetes Father    Cancer Paternal Grandmother        vulvar cancer    Health Maintenance  Topic Date Due   Hepatitis C Screening  Never done   Pneumococcal Vaccine 100-40 Years old (1 of 2 - PCV)  Never done   Zoster Vaccines- Shingrix (1 of 2) Never done   Colonoscopy  11/07/2020   MAMMOGRAM  09/26/2024   Cervical Cancer Screening (HPV/Pap Cotest)  03/06/2027   DTaP/Tdap/Td (3 - Td or Tdap) 02/10/2032   HIV Screening  Completed   HPV VACCINES  Aged Out   Meningococcal B Vaccine  Aged Out   COVID-19 Vaccine  Discontinued     ----------------------------------------------------------------------------------------------------------------------------------------------------------------------------------------------------------------- Physical Exam BP 103/60 (BP Location: Left Arm, Patient Position: Sitting, Cuff Size: Normal)   Pulse 69   Ht 5' 5.5" (1.664 m)   Wt 180 lb 12 oz (82 kg)   LMP 08/02/2014   SpO2 96%   BMI 29.62 kg/m   Physical Exam Constitutional:      General: She is not in acute distress. HENT:     Head: Normocephalic and atraumatic.     Right Ear: Tympanic membrane and ear canal normal.     Left Ear: Tympanic membrane and ear canal normal.     Nose: Nose normal.  Eyes:     General: No scleral icterus.    Conjunctiva/sclera: Conjunctivae normal.  Neck:     Thyroid: No thyromegaly.  Cardiovascular:     Rate and Rhythm: Normal rate and regular rhythm.     Heart sounds: Normal heart sounds.  Pulmonary:     Effort: Pulmonary effort is normal.     Breath sounds: Normal breath sounds.  Abdominal:     General: Bowel sounds are normal. There is no distension.     Palpations: Abdomen is soft.     Tenderness: There is no abdominal tenderness. There is no guarding.  Musculoskeletal:        General: Normal range of motion.     Cervical back: Normal range of motion and neck supple.  Lymphadenopathy:     Cervical: No cervical adenopathy.  Skin:    General: Skin is warm and dry.     Findings: No rash.  Neurological:     General: No focal deficit present.     Mental Status: She is alert and oriented to person, place, and time.     Cranial Nerves: No  cranial nerve deficit.     Coordination: Coordination normal.  Psychiatric:        Mood and Affect: Mood normal.        Behavior: Behavior normal.     ------------------------------------------------------------------------------------------------------------------------------------------------------------------------------------------------------------------- Assessment and Plan  Well adult exam Well adult Orders Placed This Encounter  Procedures   CMP14+EGFR   CBC with Differential/Platelet   Lipid Panel With LDL/HDL Ratio   Vitamin D  (25 hydroxy)  Screenings: per lab orders.  Immunizations:  Declines. Anticipatory guidance/Risk factor reduction:  Recommendations per AVS.    Meds  ordered this encounter  Medications   alendronate  (FOSAMAX ) 70 MG tablet    Sig: TAKE 1 TABLET(70 MG) BY MOUTH EVERY 7 DAYS WITH A FULL GLASS OF WATER AND ON AN EMPTY STOMACH    Dispense:  12 tablet    Refill:  3   rosuvastatin  (CRESTOR ) 10 MG tablet    Sig: Take 1 tablet (10 mg total) by mouth daily.    Dispense:  90 tablet    Refill:  3    ZERO refills remain on this prescription. Your patient is requesting advance approval of refills for this medication to PREVENT ANY MISSED DOSES    No follow-ups on file.

## 2024-04-16 NOTE — Patient Instructions (Signed)
 Preventive Care 57-57 Years Old, Female  Preventive care refers to lifestyle choices and visits with your health care provider that can promote health and wellness. Preventive care visits are also called wellness exams.  What can I expect for my preventive care visit?  Counseling  Your health care provider may ask you questions about your:  Medical history, including:  Past medical problems.  Family medical history.  Pregnancy history.  Current health, including:  Menstrual cycle.  Method of birth control.  Emotional well-being.  Home life and relationship well-being.  Sexual activity and sexual health.  Lifestyle, including:  Alcohol, nicotine or tobacco, and drug use.  Access to firearms.  Diet, exercise, and sleep habits.  Work and work Astronomer.  Sunscreen use.  Safety issues such as seatbelt and bike helmet use.  Physical exam  Your health care provider will check your:  Height and weight. These may be used to calculate your BMI (body mass index). BMI is a measurement that tells if you are at a healthy weight.  Waist circumference. This measures the distance around your waistline. This measurement also tells if you are at a healthy weight and may help predict your risk of certain diseases, such as type 2 diabetes and high blood pressure.  Heart rate and blood pressure.  Body temperature.  Skin for abnormal spots.  What immunizations do I need?    Vaccines are usually given at various ages, according to a schedule. Your health care provider will recommend vaccines for you based on your age, medical history, and lifestyle or other factors, such as travel or where you work.  What tests do I need?  Screening  Your health care provider may recommend screening tests for certain conditions. This may include:  Lipid and cholesterol levels.  Diabetes screening. This is done by checking your blood sugar (glucose) after you have not eaten for a while (fasting).  Pelvic exam and Pap test.  Hepatitis B test.  Hepatitis C  test.  HIV (human immunodeficiency virus) test.  STI (sexually transmitted infection) testing, if you are at risk.  Lung cancer screening.  Colorectal cancer screening.  Mammogram. Talk with your health care provider about when you should start having regular mammograms. This may depend on whether you have a family history of breast cancer.  BRCA-related cancer screening. This may be done if you have a family history of breast, ovarian, tubal, or peritoneal cancers.  Bone density scan. This is done to screen for osteoporosis.  Talk with your health care provider about your test results, treatment options, and if necessary, the need for more tests.  Follow these instructions at home:  Eating and drinking    Eat a diet that includes fresh fruits and vegetables, whole grains, lean protein, and low-fat dairy products.  Take vitamin and mineral supplements as recommended by your health care provider.  Do not drink alcohol if:  Your health care provider tells you not to drink.  You are pregnant, may be pregnant, or are planning to become pregnant.  If you drink alcohol:  Limit how much you have to 0-1 drink a day.  Know how much alcohol is in your drink. In the [place] one drink equals one 12 oz bottle of beer (355 mL), one 5 oz glass of wine (148 mL), or one 1 oz glass of hard liquor (44 mL).  Lifestyle  Brush your teeth every morning and night with fluoride toothpaste. Floss one time each day.  Exercise for at least  30 minutes 5 or more days each week.  Do not use any products that contain nicotine or tobacco. These products include cigarettes, chewing tobacco, and vaping devices, such as e-cigarettes. If you need help quitting, ask your health care provider.  Do not use drugs.  If you are sexually active, practice safe sex. Use a condom or other form of protection to prevent STIs.  If you do not wish to become pregnant, use a form of birth control. If you plan to become pregnant, see your health care provider for a  prepregnancy visit.  Take aspirin only as told by your health care provider. Make sure that you understand how much to take and what form to take. Work with your health care provider to find out whether it is safe and beneficial for you to take aspirin daily.  Find healthy ways to manage stress, such as:  Meditation, yoga, or listening to music.  Journaling.  Talking to a trusted person.  Spending time with friends and family.  Minimize exposure to UV radiation to reduce your risk of skin cancer.  Safety  Always wear your seat belt while driving or riding in a vehicle.  Do not drive:  If you have been drinking alcohol. Do not ride with someone who has been drinking.  When you are tired or distracted.  While texting.  If you have been using any mind-altering substances or drugs.  Wear a helmet and other protective equipment during sports activities.  If you have firearms in your house, make sure you follow all gun safety procedures.  Seek help if you have been physically or sexually abused.  What's next?  Visit your health care provider once a year for an annual wellness visit.  Ask your health care provider how often you should have your eyes and teeth checked.  Stay up to date on all vaccines.  This information is not intended to replace advice given to you by your health care provider. Make sure you discuss any questions you have with your health care provider.  Document Revised: 05/03/2021 Document Reviewed: 05/03/2021  Elsevier Patient Education  2024 [employer].

## 2024-04-16 NOTE — Assessment & Plan Note (Addendum)
 Well adult Orders Placed This Encounter  Procedures   CMP14+EGFR   CBC with Differential/Platelet   Lipid Panel With LDL/HDL Ratio   Vitamin D  (25 hydroxy)  Screenings: per lab orders.  Immunizations:  Declines. Anticipatory guidance/Risk factor reduction:  Recommendations per AVS.

## 2024-04-17 ENCOUNTER — Ambulatory Visit: Payer: Self-pay | Admitting: Family Medicine

## 2024-04-17 LAB — CBC WITH DIFFERENTIAL/PLATELET
Basophils Absolute: 0.1 10*3/uL (ref 0.0–0.2)
Basos: 1 %
EOS (ABSOLUTE): 0.2 10*3/uL (ref 0.0–0.4)
Eos: 2 %
Hematocrit: 38 % (ref 34.0–46.6)
Hemoglobin: 12.6 g/dL (ref 11.1–15.9)
Immature Grans (Abs): 0 10*3/uL (ref 0.0–0.1)
Immature Granulocytes: 0 %
Lymphocytes Absolute: 2.3 10*3/uL (ref 0.7–3.1)
Lymphs: 27 %
MCH: 31.3 pg (ref 26.6–33.0)
MCHC: 33.2 g/dL (ref 31.5–35.7)
MCV: 94 fL (ref 79–97)
Monocytes Absolute: 0.7 10*3/uL (ref 0.1–0.9)
Monocytes: 9 %
Neutrophils Absolute: 5.3 10*3/uL (ref 1.4–7.0)
Neutrophils: 61 %
Platelets: 250 10*3/uL (ref 150–450)
RBC: 4.03 x10E6/uL (ref 3.77–5.28)
RDW: 14 % (ref 11.7–15.4)
WBC: 8.6 10*3/uL (ref 3.4–10.8)

## 2024-04-17 LAB — CMP14+EGFR
ALT: 15 IU/L (ref 0–32)
AST: 23 IU/L (ref 0–40)
Albumin: 4.5 g/dL (ref 3.8–4.9)
Alkaline Phosphatase: 76 IU/L (ref 44–121)
BUN/Creatinine Ratio: 24 — ABNORMAL HIGH (ref 9–23)
BUN: 21 mg/dL (ref 6–24)
Bilirubin Total: 0.4 mg/dL (ref 0.0–1.2)
CO2: 23 mmol/L (ref 20–29)
Calcium: 9.2 mg/dL (ref 8.7–10.2)
Chloride: 101 mmol/L (ref 96–106)
Creatinine, Ser: 0.89 mg/dL (ref 0.57–1.00)
Globulin, Total: 2.2 g/dL (ref 1.5–4.5)
Glucose: 85 mg/dL (ref 70–99)
Potassium: 4 mmol/L (ref 3.5–5.2)
Sodium: 141 mmol/L (ref 134–144)
Total Protein: 6.7 g/dL (ref 6.0–8.5)
eGFR: 76 mL/min/{1.73_m2} (ref >59)

## 2024-04-17 LAB — LIPID PANEL WITH LDL/HDL RATIO
Cholesterol, Total: 190 mg/dL (ref 100–199)
HDL: 47 mg/dL (ref >39)
LDL Chol Calc (NIH): 93 mg/dL (ref 0–99)
LDL/HDL Ratio: 2 ratio (ref 0.0–3.2)
Triglycerides: 301 mg/dL — ABNORMAL HIGH (ref 0–149)
VLDL Cholesterol Cal: 50 mg/dL — ABNORMAL HIGH (ref 5–40)

## 2024-04-17 LAB — VITAMIN D 25 HYDROXY (VIT D DEFICIENCY, FRACTURES): Vit D, 25-Hydroxy: 65.6 ng/mL (ref 30.0–100.0)

## 2024-05-06 DIAGNOSIS — B351 Tinea unguium: Secondary | ICD-10-CM | POA: Diagnosis not present

## 2024-05-06 DIAGNOSIS — D485 Neoplasm of uncertain behavior of skin: Secondary | ICD-10-CM | POA: Diagnosis not present

## 2024-05-06 DIAGNOSIS — L821 Other seborrheic keratosis: Secondary | ICD-10-CM | POA: Diagnosis not present

## 2024-06-24 DIAGNOSIS — R102 Pelvic and perineal pain: Secondary | ICD-10-CM | POA: Diagnosis not present

## 2024-06-24 DIAGNOSIS — R300 Dysuria: Secondary | ICD-10-CM | POA: Diagnosis not present

## 2024-06-24 DIAGNOSIS — R310 Gross hematuria: Secondary | ICD-10-CM | POA: Diagnosis not present

## 2024-06-24 DIAGNOSIS — R3129 Other microscopic hematuria: Secondary | ICD-10-CM | POA: Diagnosis not present

## 2024-06-25 ENCOUNTER — Ambulatory Visit: Admitting: Podiatry

## 2024-06-25 ENCOUNTER — Encounter: Payer: Self-pay | Admitting: Podiatry

## 2024-06-25 ENCOUNTER — Telehealth: Payer: Self-pay | Admitting: *Deleted

## 2024-06-25 ENCOUNTER — Ambulatory Visit (INDEPENDENT_AMBULATORY_CARE_PROVIDER_SITE_OTHER)

## 2024-06-25 DIAGNOSIS — M19071 Primary osteoarthritis, right ankle and foot: Secondary | ICD-10-CM | POA: Diagnosis not present

## 2024-06-25 DIAGNOSIS — M7731 Calcaneal spur, right foot: Secondary | ICD-10-CM | POA: Diagnosis not present

## 2024-06-25 DIAGNOSIS — D2371 Other benign neoplasm of skin of right lower limb, including hip: Secondary | ICD-10-CM | POA: Diagnosis not present

## 2024-06-25 DIAGNOSIS — M7662 Achilles tendinitis, left leg: Secondary | ICD-10-CM

## 2024-06-25 DIAGNOSIS — M898X9 Other specified disorders of bone, unspecified site: Secondary | ICD-10-CM

## 2024-06-25 DIAGNOSIS — M79672 Pain in left foot: Secondary | ICD-10-CM | POA: Diagnosis not present

## 2024-06-25 DIAGNOSIS — M79671 Pain in right foot: Secondary | ICD-10-CM

## 2024-06-25 DIAGNOSIS — M7732 Calcaneal spur, left foot: Secondary | ICD-10-CM | POA: Diagnosis not present

## 2024-06-25 MED ORDER — MELOXICAM 15 MG PO TABS: 15.0000 mg | ORAL_TABLET | Freq: Every day | ORAL | 0 refills | Status: AC

## 2024-06-25 MED ORDER — DEXAMETHASONE SODIUM PHOSPHATE 120 MG/30ML IJ SOLN
4.0000 mg | Freq: Once | INTRAMUSCULAR | Status: AC
Start: 2024-06-25 — End: 2024-06-25
  Administered 2024-06-25: 4 mg via INTRA_ARTICULAR

## 2024-06-25 MED ORDER — TRIAMCINOLONE ACETONIDE 10 MG/ML IJ SUSP
2.5000 mg | Freq: Once | INTRAMUSCULAR | Status: AC
Start: 2024-06-25 — End: 2024-06-25
  Administered 2024-06-25: 2.5 mg via INTRA_ARTICULAR

## 2024-06-25 NOTE — Patient Instructions (Signed)

## 2024-06-25 NOTE — Telephone Encounter (Signed)
 I called and asked the patient to come in a few minutes early to her appointment because we will need xrays of her feet.

## 2024-06-25 NOTE — Progress Notes (Signed)
  Subjective:  Patient ID: Jennifer [name], female    DOB: 10-Oct-1966,   MRN: [id number]  Chief Complaint  Patient presents with   Foot Pain    I have pain on top of my right foot.  I have some sort of tender spot on the back of my left heel.   Callouses    I think I have a wart on the bottom of my right foot.    58 y.o. female presents for multiple concerns including pain on the top of the right foot and bottom of the right foot a possible lesion. Also relates tenderness on the back of the left heel. Relates the right foot pain has been ongoing for years and has had injections in the past that have helped. Relates the lesion on the bottom has been present for a while and recently become more painful. Also relates newer concern of pain in the back of the left heel. Denies any current treatments for this  . Denies any other pedal complaints. Denies n/v/f/c.   Past Medical History:  Diagnosis Date   Fibroids     Objective:  Physical Exam: Vascular: DP/PT pulses 2/4 bilateral. CFT <3 seconds. Normal hair growth on digits. No edema.  Skin. No lacerations or abrasions bilateral feet. Hyperkeratotic lesion noted sub second metatarsal head on right.  Musculoskeletal: MMT 5/5 bilateral lower extremities in DF, PF, Inversion and Eversion. Deceased ROM in DF of ankle joint. Tender around second tarsometatarsal joint on right and pain with ROM of the second ray and some in third ray. Tender to insertion of achilles tendon on the left and some pain with ROM of the ankle. No pain elsewhere about the feet.  Neurological: Sensation intact to light touch.   Assessment:   1. Arthritis of right midfoot   2. Benign neoplasm of skin of right foot   3. Tendonitis, Achilles, left       Plan:  Patient was evaluated and treated and all questions answered. -Xrays reviewed. No acute fractures or dislocations. Spurring noted to posterior heel on left. Mild dedgenerative changes noted in right midfoot.   -Discussed Achilles insertional tendonitis and treatment options with patient.  -Discussed stretching exercises. -Rx Meloxicam  provided  -Heel lifts provided and discussed proper shoewear.  -Discussed if no improvement will consider MRI/PT/EPAT/PRP injections.  Discussed midfoot arthritis with patient and treatment options.  Discussed NSAIDS, topicals, and possible injections.  Injection offered today. Procedure below. Discussed stiff soled shoes and carbon fiber foot plate.  Discussed if pain does not improve can discuss surgical options.  Discussed benign skin lesion on foot and debrided as couretsy today.  Patient to follow-up in 6 weeks for recheck   Procedure: Injection Tendon/Ligament Discussed alternatives, risks, complications and verbal consent was obtained.  Location: Right second tarsometatarsal joint . Skin Prep: Alcohol. Injectate: 1cc 0.5% marcaine plain, 1 cc dexamethasone  0.5 cc kenalog   Disposition: Patient tolerated procedure well. Injection site dressed with a band-aid.  Post-injection care was discussed and return precautions discussed.      Asberry Failing, DPM

## 2024-06-29 DIAGNOSIS — R310 Gross hematuria: Secondary | ICD-10-CM | POA: Diagnosis not present

## 2024-08-04 DIAGNOSIS — H903 Sensorineural hearing loss, bilateral: Secondary | ICD-10-CM | POA: Diagnosis not present

## 2024-08-06 ENCOUNTER — Ambulatory Visit: Admitting: Podiatry

## 2024-08-13 DIAGNOSIS — R310 Gross hematuria: Secondary | ICD-10-CM | POA: Diagnosis not present

## 2024-08-13 DIAGNOSIS — R3129 Other microscopic hematuria: Secondary | ICD-10-CM | POA: Diagnosis not present

## 2024-08-14 ENCOUNTER — Ambulatory Visit: Admitting: Podiatry

## 2024-08-14 ENCOUNTER — Encounter: Payer: Self-pay | Admitting: Podiatry

## 2024-08-14 DIAGNOSIS — M19071 Primary osteoarthritis, right ankle and foot: Secondary | ICD-10-CM

## 2024-08-14 DIAGNOSIS — M7662 Achilles tendinitis, left leg: Secondary | ICD-10-CM

## 2024-08-14 NOTE — Progress Notes (Signed)
  Subjective:  Patient ID: Jennifer [name], female    DOB: Apr 29, 1966,   MRN: [id number]  Chief Complaint  Patient presents with   Arthritis    I started feeling some pain in my right foot occasionally.   Toe Pain    The second toe on my right foot is starting to get like the left toe.  The left toe is still big.    58 y.o. female presents for follow-up of right midfoot arthritis. Relates starting to get some pain occasionally. She also relates some new concerns with the second toe on the right.    multiple concerns including pain on the top of the right foot and bottom of the right foot a possible lesion. Also relates tenderness on the back of the left heel. Relates the right foot pain has been ongoing for years and has had injections in the past that have helped. Relates the lesion on the bottom has been present for a while and recently become more painful. Also relates newer concern of pain in the back of the left heel. Denies any current treatments for this  . Denies any other pedal complaints. Denies n/v/f/c.   Past Medical History:  Diagnosis Date   Fibroids     Objective:  Physical Exam: Vascular: DP/PT pulses 2/4 bilateral. CFT <3 seconds. Normal hair growth on digits. No edema.  Skin. No lacerations or abrasions bilateral feet. Hyperkeratotic lesion noted sub second metatarsal head on right.  Musculoskeletal: MMT 5/5 bilateral lower extremities in DF, PF, Inversion and Eversion. Deceased ROM in DF of ankle joint. Tender around second tarsometatarsal joint on right and pain with ROM of the second ray and some in third ray. Tender to insertion of achilles tendon on the left and some pain with ROM of the ankle. No pain elsewhere about the feet.  Neurological: Sensation intact to light touch.   Assessment:   1. Arthritis of right midfoot   2. Tendonitis, Achilles, left        Plan:  Patient was evaluated and treated and all questions answered. -Xrays reviewed. No acute  fractures or dislocations. Spurring noted to posterior heel on left. Mild dedgenerative changes noted in right midfoot.  -Discussed Achilles insertional tendonitis and treatment options with patient.  -Continue stretching and anti-inflammatories  -Discussed if no improvement will consider MRI/PT/EPAT/PRP injections.  Discussed midfoot arthritis with patient and treatment options.  Discussed NSAIDS, topicals, and possible injections.  Injection offered but will hold off until a later time.  Continue stiff supportive shoes.  Discussed if pain does not improve can discuss surgical options.  Discussed benign skin lesion on foot and debrided as couretsy today.  Patient to follow-up as needed  .      Asberry Failing, DPM

## 2024-09-08 DIAGNOSIS — Z1231 Encounter for screening mammogram for malignant neoplasm of breast: Secondary | ICD-10-CM | POA: Diagnosis not present

## 2024-09-08 DIAGNOSIS — R92313 Mammographic fatty tissue density, bilateral breasts: Secondary | ICD-10-CM | POA: Diagnosis not present

## 2024-09-08 LAB — HM MAMMOGRAPHY

## 2024-09-15 ENCOUNTER — Encounter: Payer: Self-pay | Admitting: Family Medicine

## 2024-10-14 ENCOUNTER — Encounter: Payer: Self-pay | Admitting: Family Medicine

## 2024-10-14 DIAGNOSIS — M810 Age-related osteoporosis without current pathological fracture: Secondary | ICD-10-CM

## 2024-10-19 MED ORDER — ALENDRONATE SODIUM 70 MG PO TABS: ORAL_TABLET | ORAL | 0 refills | Status: DC

## 2024-10-19 MED ORDER — ROSUVASTATIN CALCIUM 10 MG PO TABS: 10.0000 mg | ORAL_TABLET | Freq: Every day | ORAL | 0 refills | Status: AC

## 2024-11-25 ENCOUNTER — Other Ambulatory Visit: Payer: Self-pay | Admitting: Family Medicine

## 2024-11-25 DIAGNOSIS — M810 Age-related osteoporosis without current pathological fracture: Secondary | ICD-10-CM

## 2024-12-28 ENCOUNTER — Ambulatory Visit: Admitting: Family Medicine

## 2025-04-19 ENCOUNTER — Encounter: Admitting: Family Medicine
